# Patient Record
Sex: Male | Born: 1985 | Race: Black or African American | Hispanic: No | Smoking: Current some day smoker
Health system: Southern US, Community
[De-identification: ages and names within clinical notes are randomized; demographics above are authoritative.]

---
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
## 2004-12-08 ENCOUNTER — Emergency Department (HOSPITAL_COMMUNITY): Admission: EM | Admit: 2004-12-08 | Discharge: 2004-12-09 | Payer: Self-pay | Admitting: Emergency Medicine

## 2005-07-23 ENCOUNTER — Emergency Department (HOSPITAL_COMMUNITY): Admission: EM | Admit: 2005-07-23 | Discharge: 2005-07-23 | Payer: Self-pay | Admitting: Emergency Medicine

## 2006-04-04 ENCOUNTER — Emergency Department (HOSPITAL_COMMUNITY): Admission: EM | Admit: 2006-04-04 | Discharge: 2006-04-04 | Payer: Self-pay | Admitting: Emergency Medicine

## 2007-11-24 ENCOUNTER — Emergency Department (HOSPITAL_COMMUNITY): Admission: EM | Admit: 2007-11-24 | Discharge: 2007-11-24 | Payer: Self-pay | Admitting: Emergency Medicine

## 2008-04-20 ENCOUNTER — Emergency Department (HOSPITAL_COMMUNITY): Admission: EM | Admit: 2008-04-20 | Discharge: 2008-04-20 | Payer: Self-pay | Admitting: Emergency Medicine

## 2009-02-23 ENCOUNTER — Observation Stay (HOSPITAL_COMMUNITY): Admission: EM | Admit: 2009-02-23 | Discharge: 2009-02-23 | Payer: Self-pay | Admitting: Emergency Medicine

## 2011-02-28 ENCOUNTER — Emergency Department (HOSPITAL_COMMUNITY)
Admission: EM | Admit: 2011-02-28 | Discharge: 2011-02-28 | Disposition: A | Discharge status: Home / Self Care | Payer: Self-pay | Attending: Emergency Medicine | Admitting: Emergency Medicine

## 2011-02-28 DIAGNOSIS — K089 Disorder of teeth and supporting structures, unspecified: Secondary | ICD-10-CM | POA: Insufficient documentation

## 2011-02-28 DIAGNOSIS — K047 Periapical abscess without sinus: Secondary | ICD-10-CM | POA: Insufficient documentation

## 2011-02-28 DIAGNOSIS — R220 Localized swelling, mass and lump, head: Secondary | ICD-10-CM | POA: Insufficient documentation

## 2011-02-28 DIAGNOSIS — K029 Dental caries, unspecified: Secondary | ICD-10-CM | POA: Insufficient documentation

## 2011-02-28 DIAGNOSIS — R51 Headache: Secondary | ICD-10-CM | POA: Insufficient documentation

## 2013-02-18 ENCOUNTER — Encounter (HOSPITAL_COMMUNITY): Payer: Self-pay

## 2013-02-18 ENCOUNTER — Emergency Department (HOSPITAL_COMMUNITY): Payer: Self-pay

## 2013-02-18 ENCOUNTER — Emergency Department (HOSPITAL_COMMUNITY)
Admission: EM | Admit: 2013-02-18 | Discharge: 2013-02-18 | Disposition: A | Discharge status: Home / Self Care | Payer: Self-pay | Attending: Emergency Medicine | Admitting: Emergency Medicine

## 2013-02-18 DIAGNOSIS — Y9289 Other specified places as the place of occurrence of the external cause: Secondary | ICD-10-CM | POA: Insufficient documentation

## 2013-02-18 DIAGNOSIS — W2209XA Striking against other stationary object, initial encounter: Secondary | ICD-10-CM | POA: Insufficient documentation

## 2013-02-18 DIAGNOSIS — F17200 Nicotine dependence, unspecified, uncomplicated: Secondary | ICD-10-CM | POA: Insufficient documentation

## 2013-02-18 DIAGNOSIS — R059 Cough, unspecified: Secondary | ICD-10-CM | POA: Insufficient documentation

## 2013-02-18 DIAGNOSIS — S301XXA Contusion of abdominal wall, initial encounter: Secondary | ICD-10-CM | POA: Insufficient documentation

## 2013-02-18 DIAGNOSIS — Y9302 Activity, running: Secondary | ICD-10-CM | POA: Insufficient documentation

## 2013-02-18 DIAGNOSIS — R269 Unspecified abnormalities of gait and mobility: Secondary | ICD-10-CM | POA: Insufficient documentation

## 2013-02-18 DIAGNOSIS — R369 Urethral discharge, unspecified: Secondary | ICD-10-CM | POA: Insufficient documentation

## 2013-02-18 DIAGNOSIS — R05 Cough: Secondary | ICD-10-CM | POA: Insufficient documentation

## 2013-02-18 DIAGNOSIS — R300 Dysuria: Secondary | ICD-10-CM | POA: Insufficient documentation

## 2013-02-18 LAB — CBC WITH DIFFERENTIAL/PLATELET
Basophils Absolute: 0 10*3/uL (ref 0.0–0.1)
Basophils Relative: 0 % (ref 0–1)
Eosinophils Absolute: 0.1 10*3/uL (ref 0.0–0.7)
Eosinophils Relative: 1 % (ref 0–5)
HCT: 40.7 % (ref 39.0–52.0)
Hemoglobin: 14.1 g/dL (ref 13.0–17.0)
Lymphocytes Relative: 19 % (ref 12–46)
Lymphs Abs: 1.6 10*3/uL (ref 0.7–4.0)
MCH: 28.4 pg (ref 26.0–34.0)
MCHC: 34.6 g/dL (ref 30.0–36.0)
MCV: 82.1 fL (ref 78.0–100.0)
Monocytes Absolute: 0.6 10*3/uL (ref 0.1–1.0)
Monocytes Relative: 7 % (ref 3–12)
Neutro Abs: 6.4 10*3/uL (ref 1.7–7.7)
Neutrophils Relative %: 73 % (ref 43–77)
Platelets: 270 10*3/uL (ref 150–400)
RBC: 4.96 MIL/uL (ref 4.22–5.81)
RDW: 13.2 % (ref 11.5–15.5)
WBC: 8.8 10*3/uL (ref 4.0–10.5)

## 2013-02-18 LAB — BASIC METABOLIC PANEL
BUN: 10 mg/dL (ref 6–23)
CO2: 27 mEq/L (ref 19–32)
Calcium: 9.2 mg/dL (ref 8.4–10.5)
Chloride: 105 mEq/L (ref 96–112)
Creatinine, Ser: 0.95 mg/dL (ref 0.50–1.35)
GFR calc Af Amer: >90 mL/min (ref >90)
GFR calc non Af Amer: >90 mL/min (ref >90)
Glucose, Bld: 91 mg/dL (ref 70–99)
Potassium: 3.8 mEq/L (ref 3.5–5.1)
Sodium: 139 mEq/L (ref 135–145)

## 2013-02-18 LAB — URINALYSIS, ROUTINE W REFLEX MICROSCOPIC
Bilirubin Urine: NEGATIVE
Glucose, UA: NEGATIVE mg/dL
Hgb urine dipstick: NEGATIVE
Ketones, ur: NEGATIVE mg/dL
Nitrite: NEGATIVE
Protein, ur: NEGATIVE mg/dL
Specific Gravity, Urine: 1.023 (ref 1.005–1.030)
Urobilinogen, UA: 0.2 mg/dL (ref 0.0–1.0)
pH: 6 (ref 5.0–8.0)

## 2013-02-18 LAB — URINE MICROSCOPIC-ADD ON

## 2013-02-18 MED ORDER — IBUPROFEN 800 MG PO TABS: 800.0000 mg | ORAL_TABLET | Freq: Three times a day (TID) | ORAL | Status: DC | PRN

## 2013-02-18 MED ORDER — MORPHINE SULFATE 4 MG/ML IJ SOLN
4.0000 mg | Freq: Once | INTRAMUSCULAR | Status: AC
  Administered 2013-02-18: 4 mg via INTRAVENOUS
  Filled 2013-02-18: qty 1

## 2013-02-18 MED ORDER — HYDROCODONE-ACETAMINOPHEN 5-325 MG PO TABS: 1.0000 | ORAL_TABLET | Freq: Four times a day (QID) | ORAL | Status: DC | PRN

## 2013-02-18 MED ORDER — IOHEXOL 300 MG/ML  SOLN
100.0000 mL | Freq: Once | INTRAMUSCULAR | Status: AC | PRN
  Administered 2013-02-18: 100 mL via INTRAVENOUS

## 2013-02-18 NOTE — Progress Notes (Signed)
[facility] provided patient with a list of primary care resources and a [facility] Orange card application.

## 2013-02-18 NOTE — ED Notes (Addendum)
Patient c/o left lower abdominal pain x 3 days. Patient denies N/V/D, dysuria, or penile discharge. Patient states he has a bulging area to the left lower abdomen.

## 2013-02-18 NOTE — ED Notes (Signed)
Patient transported to CT 

## 2013-02-18 NOTE — ED Provider Notes (Signed)
Medical screening examination/treatment/procedure(s) were performed by non-physician practitioner and as supervising physician I was immediately available for consultation/collaboration.   Christopher J. Pollina, MD 02/18/13 1416 

## 2013-02-18 NOTE — ED Provider Notes (Signed)
CSN: [id number]     Arrival date & time 02/18/13  2725 History     First MD Initiated Contact with Patient 02/18/13 1123     Chief Complaint  Patient presents with  . Abdominal Pain   (Consider location/radiation/quality/duration/timing/severity/associated sxs/prior Treatment) Patient is a 27 y.o. male presenting with abdominal pain. The history is provided by the patient.  Abdominal Pain Pain location:  L flank and LLQ Pain quality: sharp   Pain radiates to:  L flank and back Pain severity:  Severe Onset quality:  Sudden Duration:  3 days Timing:  Constant Progression:  Worsening Chronicity:  New Context: trauma (Pt was running and ran into the corner of a counter)   Relieved by:  Not moving Worsened by:  Coughing, deep breathing, movement, palpation, position changes and urination Ineffective treatments:  None tried Associated symptoms: dysuria (slight dysuria)   Associated symptoms: no anorexia, no chest pain, no constipation, no cough, no diarrhea, no fatigue, no hematemesis, no hematochezia, no hematuria, no nausea, no shortness of breath and no vomiting   Dysuria:    Severity:  Mild   Onset quality:  Unable to specify   Timing:  Intermittent   Progression:  Waxing and waning Pt presents with a 3 day hx of severe left lower abdominal pain that occurred when pt ran into the edge of a counter top. Describes pain as severe, sharp, constant pain that 'brings tears to his eyes'. Pain radiates to back and has caused left lower back pain and left hip pain. Pain has continued to worsen and is a 10/10 in intensity. Pain is worse when breathing, sneezing, and walking. Nothing improves the pain besides laying down and not moving.   History reviewed. No pertinent past medical history. History reviewed. No pertinent past surgical history. Family History  Problem Relation Age of Onset  . Hypertension Mother   . Diabetes Mother   . Hypertension Father   . Lupus Sister   . Asthma  Brother    History  Substance Use Topics  . Smoking status: Current Every Day Smoker -- 0.25 packs/day    Types: Cigarettes  . Smokeless tobacco: Never Used  . Alcohol Use: Yes     Comment: socially    Review of Systems  Constitutional: Negative for fatigue.  Respiratory: Negative for cough and shortness of breath.   Cardiovascular: Negative for chest pain.  Gastrointestinal: Positive for abdominal pain. Negative for nausea, vomiting, diarrhea, constipation, hematochezia, anorexia and hematemesis.  Genitourinary: Positive for dysuria (slight dysuria). Negative for hematuria.  All other systems negative except as documented in the HPI. All pertinent positives and negatives as reviewed in the HPI.   Allergies  Sulfa antibiotics  Home Medications  No current outpatient prescriptions on file. BP 126/73  Pulse 67  Temp(Src) 98.2 F (36.8 C) (Oral)  Resp 16  Ht 5\' 11"  (1.803 m)  Wt 139 lb 8 oz (63.277 kg)  BMI 19.47 kg/m2  SpO2 100% Physical Exam  Constitutional: He is oriented to person, place, and time. Vital signs are normal. He appears well-developed and well-nourished.  HENT:  Head: Normocephalic and atraumatic.  Cardiovascular: Normal rate, regular rhythm, S1 normal, S2 normal and normal heart sounds.   Pulmonary/Chest: Effort normal and breath sounds normal. No respiratory distress.  Abdominal: Soft. Bowel sounds are normal. He exhibits no distension. There is tenderness (Extreme tenderness to light palpation in lateral left lower quadrant and flank area.). There is rebound (Pressure on right side produces rebound tenderness in  LLQ.) and guarding. There is no CVA tenderness.    Musculoskeletal: He exhibits tenderness.       Right hip: He exhibits normal range of motion, normal strength, no tenderness, no bony tenderness, no swelling and no deformity.       Left hip: He exhibits tenderness and bony tenderness. He exhibits normal range of motion, normal strength, no  swelling and no deformity. Lacerations: .5 cm round scab visible. No bleeding, echymosis, or edema present.       Left knee: He exhibits normal range of motion, no swelling and no ecchymosis. No tenderness found.       Lumbar back: He exhibits tenderness and pain. He exhibits normal range of motion, no bony tenderness, no swelling, no edema and no deformity.  Straight leg test produces pain in LLQ.  Neurological: He is alert and oriented to person, place, and time. He has normal strength. No sensory deficit. He exhibits normal muscle tone (Pt has 5/5 strength in LE b/l. Movement and resistance of left LE produces pain in LLQ. ). Gait (pt is unable to tolerate weight on left leg.) abnormal.  Skin: Abrasion noted.     Psychiatric: He has a normal mood and affect. His speech is normal and behavior is normal. Judgment and thought content normal. His mood appears not anxious. He does not exhibit a depressed mood.    ED Course   Procedures (including critical care time)  Labs Reviewed  CBC WITH DIFFERENTIAL  BASIC METABOLIC PANEL  URINALYSIS, ROUTINE W REFLEX MICROSCOPIC   patient most likely has muscular contusion, based on his history of present illness and physical exam, along with his testing here in the emergency department.  Patient is advised return here as needed.  Patient be given pain control and advised to use ice and heat on the area that is sore.  MDM    Carlyle Dolly, PA-C 02/18/13 [phone]

## 2013-04-29 ENCOUNTER — Emergency Department (HOSPITAL_COMMUNITY)
Admission: EM | Admit: 2013-04-29 | Discharge: 2013-04-29 | Disposition: A | Discharge status: Home / Self Care | Payer: 59 | Attending: Emergency Medicine | Admitting: Emergency Medicine

## 2013-04-29 ENCOUNTER — Encounter (HOSPITAL_COMMUNITY): Payer: Self-pay | Admitting: Emergency Medicine

## 2013-04-29 DIAGNOSIS — K089 Disorder of teeth and supporting structures, unspecified: Secondary | ICD-10-CM | POA: Insufficient documentation

## 2013-04-29 DIAGNOSIS — F17200 Nicotine dependence, unspecified, uncomplicated: Secondary | ICD-10-CM | POA: Insufficient documentation

## 2013-04-29 DIAGNOSIS — K0889 Other specified disorders of teeth and supporting structures: Secondary | ICD-10-CM

## 2013-04-29 DIAGNOSIS — K029 Dental caries, unspecified: Secondary | ICD-10-CM | POA: Insufficient documentation

## 2013-04-29 MED ORDER — OXYCODONE-ACETAMINOPHEN 5-325 MG PO TABS: 1.0000 | ORAL_TABLET | ORAL | Status: DC | PRN

## 2013-04-29 MED ORDER — AMOXICILLIN 500 MG PO CAPS: 500.0000 mg | ORAL_CAPSULE | Freq: Three times a day (TID) | ORAL | Status: DC

## 2013-04-29 NOTE — ED Provider Notes (Signed)
CSN: [id number]     Arrival date & time 04/29/13  7829 History   First MD Initiated Contact with Patient 04/29/13 [phone]     Chief Complaint  Patient presents with  . Dental Pain   (Consider location/radiation/quality/duration/timing/severity/associated sxs/prior Treatment) HPI Patient presents emergency chief complaint of dental pain.  The patient states he has had worsening progressive pain in the right lower third molar for the past 3 weeks.  Patient states he's been unable to sleep for the past 3 nights.  He's been taking ibuprofen without relief.  He denies difficulty swallowing although he does have a bit of pain with swelling.  Patient denies fevers, chills, vomiting.  He has a history of Cary in the tooth.  The patient is a current every day smoker.  History reviewed. No pertinent past medical history. History reviewed. No pertinent past surgical history. Family History  Problem Relation Age of Onset  . Hypertension Mother   . Diabetes Mother   . Hypertension Father   . Lupus Sister   . Asthma Brother    History  Substance Use Topics  . Smoking status: Current Every Day Smoker -- 0.25 packs/day    Types: Cigarettes  . Smokeless tobacco: Never Used  . Alcohol Use: No     Comment: socially    Review of Systems  Constitutional: Negative for fever and chills.  HENT: Positive for dental problem. Negative for trouble swallowing and voice change.   Respiratory: Negative for stridor.   Gastrointestinal: Negative for nausea and vomiting.  Skin: Negative for rash.    Allergies  Sulfa antibiotics  Home Medications   Current Outpatient Rx  Name  Route  Sig  Dispense  Refill  . ibuprofen (ADVIL,MOTRIN) 200 MG tablet   Oral   Take 600 mg by mouth every 6 (six) hours as needed for pain.          BP 133/87  Pulse 79  Temp(Src) 98.1 F (36.7 C) (Oral)  Resp 20  Ht 5\' 11"  (1.803 m)  Wt 144 lb 14.4 oz (65.726 kg)  BMI 20.22 kg/m2  SpO2 99% Physical Exam  Nursing note  and vitals reviewed. Constitutional: He appears well-developed and well-nourished. No distress.  HENT:  Head: Normocephalic and atraumatic.  Right third lower molar with Cary and exposed dentin.  Patient is tender to palpation in the gingiva.  There is some mild erythema without fluctuance or induration.  Posterior pharynx is erythematous bilaterally.  No sign of peritonsillar abscess.  Airway is patent  Eyes: Conjunctivae are normal. No scleral icterus.  Neck: Normal range of motion. Neck supple.  Right tonsillar lymphadenopathy tender to palpation  Cardiovascular: Normal rate, regular rhythm and normal heart sounds.   Pulmonary/Chest: Effort normal and breath sounds normal. No respiratory distress.  Abdominal: Soft. There is no tenderness.  Musculoskeletal: He exhibits no edema.  Neurological: He is alert.  Skin: Skin is warm and dry. He is not diaphoretic.  Psychiatric: His behavior is normal.    ED Course  Procedures (including critical care time) Labs Review Labs Reviewed - No data to display Imaging Review No results found.  EKG Interpretation   None      Dental Performed by: Arthor Captain Authorized by: Arthor Captain Consent: Verbal consent obtained. Patient understanding: patient states understanding of the procedure being performed Patient identity confirmed: verbally with patient Local anesthesia used: yes Local anesthetic: bupivacaine 0.5% with epinephrine Anesthetic total:0.4 ml Patient sedated: no Patient tolerance: Patient tolerated the procedure well with  no immediate complications.    MDM   1. Pain, dental    9:09 AM  Filed Vitals:   04/29/13 0824  BP: 133/87  Pulse: 79  Temp: 98.1 F (36.7 C)  Resp: 20   Patient with toothache.  No gross abscess.  Exam unconcerning for Ludwig's angina or spread of infection.  Will treat with penicillin and pain medicine.  Urged patient to follow-up with dentist.       Arthor Captain, PA-C 04/29/13  [phone]

## 2013-04-29 NOTE — ED Provider Notes (Signed)
Medical screening examination/treatment/procedure(s) were performed by non-physician practitioner and as supervising physician I was immediately available for consultation/collaboration.  EKG Interpretation   None         Jaine Estabrooks T Ambar Raphael, MD 04/29/13 1657 

## 2013-04-29 NOTE — ED Notes (Signed)
C/O right lower dental pain x 3 weeks.

## 2013-11-11 ENCOUNTER — Emergency Department (HOSPITAL_COMMUNITY)
Admission: EM | Admit: 2013-11-11 | Discharge: 2013-11-11 | Disposition: A | Discharge status: Home / Self Care | Payer: 59 | Attending: Emergency Medicine | Admitting: Emergency Medicine

## 2013-11-11 ENCOUNTER — Encounter (HOSPITAL_COMMUNITY): Payer: Self-pay | Admitting: Emergency Medicine

## 2013-11-11 DIAGNOSIS — Z23 Encounter for immunization: Secondary | ICD-10-CM | POA: Insufficient documentation

## 2013-11-11 DIAGNOSIS — W268XXA Contact with other sharp object(s), not elsewhere classified, initial encounter: Secondary | ICD-10-CM | POA: Insufficient documentation

## 2013-11-11 DIAGNOSIS — S61519A Laceration without foreign body of unspecified wrist, initial encounter: Secondary | ICD-10-CM

## 2013-11-11 DIAGNOSIS — Z792 Long term (current) use of antibiotics: Secondary | ICD-10-CM | POA: Insufficient documentation

## 2013-11-11 DIAGNOSIS — S61509A Unspecified open wound of unspecified wrist, initial encounter: Secondary | ICD-10-CM | POA: Insufficient documentation

## 2013-11-11 DIAGNOSIS — F17200 Nicotine dependence, unspecified, uncomplicated: Secondary | ICD-10-CM | POA: Insufficient documentation

## 2013-11-11 DIAGNOSIS — Y9289 Other specified places as the place of occurrence of the external cause: Secondary | ICD-10-CM | POA: Insufficient documentation

## 2013-11-11 DIAGNOSIS — Y9383 Activity, rough housing and horseplay: Secondary | ICD-10-CM | POA: Insufficient documentation

## 2013-11-11 MED ORDER — TETANUS-DIPHTH-ACELL PERTUSSIS 5-2.5-18.5 LF-MCG/0.5 IM SUSP
0.5000 mL | Freq: Once | INTRAMUSCULAR | Status: AC
  Administered 2013-11-11: 0.5 mL via INTRAMUSCULAR
  Filled 2013-11-11: qty 0.5

## 2013-11-11 NOTE — Discharge Instructions (Signed)
Laceration Care, Adult YOUR SUTURES SHOULD BE REMOVED IN 7-10 DAYS.  A laceration is a cut or lesion that goes through all layers of the skin and into the tissue just beneath the skin. TREATMENT  Some lacerations may not require closure. Some lacerations may not be able to be closed due to an increased risk of infection. It is important to see your caregiver as soon as possible after an injury to minimize the risk of infection and maximize the opportunity for successful closure. If closure is appropriate, pain medicines may be given, if needed. The wound will be cleaned to help prevent infection. Your caregiver will use stitches (sutures), staples, wound glue (adhesive), or skin adhesive strips to repair the laceration. These tools bring the skin edges together to allow for faster healing and a better cosmetic outcome. However, all wounds will heal with a scar. Once the wound has healed, scarring can be minimized by covering the wound with sunscreen during the day for 1 full year. HOME CARE INSTRUCTIONS  For sutures or staples:  Keep the wound clean and dry.  If you were given a bandage (dressing), you should change it at least once a day. Also, change the dressing if it becomes wet or dirty, or as directed by your caregiver.  Wash the wound with soap and water 2 times a day. Rinse the wound off with water to remove all soap. Pat the wound dry with a clean towel.  After cleaning, apply a thin layer of the antibiotic ointment as recommended by your caregiver. This will help prevent infection and keep the dressing from sticking.  You may shower as usual after the first 24 hours. Do not soak the wound in water until the sutures are removed.  Only take over-the-counter or prescription medicines for pain, discomfort, or fever as directed by your caregiver.  Get your sutures or staples removed as directed by your caregiver. For skin adhesive strips:  Keep the wound clean and dry.  Do not get the  skin adhesive strips wet. You may bathe carefully, using caution to keep the wound dry.  If the wound gets wet, pat it dry with a clean towel.  Skin adhesive strips will fall off on their own. You may trim the strips as the wound heals. Do not remove skin adhesive strips that are still stuck to the wound. They will fall off in time. For wound adhesive:  You may briefly wet your wound in the shower or bath. Do not soak or scrub the wound. Do not swim. Avoid periods of heavy perspiration until the skin adhesive has fallen off on its own. After showering or bathing, gently pat the wound dry with a clean towel.  Do not apply liquid medicine, cream medicine, or ointment medicine to your wound while the skin adhesive is in place. This may loosen the film before your wound is healed.  If a dressing is placed over the wound, be careful not to apply tape directly over the skin adhesive. This may cause the adhesive to be pulled off before the wound is healed.  Avoid prolonged exposure to sunlight or tanning lamps while the skin adhesive is in place. Exposure to ultraviolet light in the first year will darken the scar.  The skin adhesive will usually remain in place for 5 to 10 days, then naturally fall off the skin. Do not pick at the adhesive film. You may need a tetanus shot if:  You cannot remember when you had your last  tetanus shot.  You have never had a tetanus shot. If you get a tetanus shot, your arm may swell, get red, and feel warm to the touch. This is common and not a problem. If you need a tetanus shot and you choose not to have one, there is a rare chance of getting tetanus. Sickness from tetanus can be serious. SEEK MEDICAL CARE IF:   You have redness, swelling, or increasing pain in the wound.  You see a red line that goes away from the wound.  You have yellowish-white fluid (pus) coming from the wound.  You have a fever.  You notice a bad smell coming from the wound or  dressing.  Your wound breaks open before or after sutures have been removed.  You notice something coming out of the wound such as wood or glass.  Your wound is on your hand or foot and you cannot move a finger or toe. SEEK IMMEDIATE MEDICAL CARE IF:   Your pain is not controlled with prescribed medicine.  You have severe swelling around the wound causing pain and numbness or a change in color in your arm, hand, leg, or foot.  Your wound splits open and starts bleeding.  You have worsening numbness, weakness, or loss of function of any joint around or beyond the wound.  You develop painful lumps near the wound or on the skin anywhere on your body. MAKE SURE YOU:   Understand these instructions.  Will watch your condition.  Will get help right away if you are not doing well or get worse. Document Released: 06/13/2005 Document Revised: 09/05/2011 Document Reviewed: 12/07/2010 [employer] Patient Information 2014 [place], [place].

## 2013-11-11 NOTE — ED Provider Notes (Signed)
CSN: [id number]     Arrival date & time 11/11/13  0213 History   First MD Initiated Contact with Patient 11/11/13 0239     Chief Complaint  Patient presents with  . Extremity Laceration     (Consider location/radiation/quality/duration/timing/severity/associated sxs/prior Treatment) HPI  This a 28 year old male with no significant past medical history who presents with a laceration to the left wrist. Patient states that he was "horsing around" with his little brother he cut his left wrist on piece of metal. Bleeding is controlled. Last tetanus greater than 5 years ago. He denies any other injury. Denies any weakness, numbness or tingling in the hand.  History reviewed. No pertinent past medical history. History reviewed. No pertinent past surgical history. Family History  Problem Relation Age of Onset  . Hypertension Mother   . Diabetes Mother   . Hypertension Father   . Lupus Sister   . Asthma Brother    History  Substance Use Topics  . Smoking status: Current Every Day Smoker -- 0.25 packs/day    Types: Cigarettes  . Smokeless tobacco: Never Used  . Alcohol Use: Yes     Comment: socially    Review of Systems  Musculoskeletal:       Wrist  Skin: Positive for wound.  Neurological: Negative for weakness and numbness.  All other systems reviewed and are negative.     Allergies  Shellfish allergy and Sulfa antibiotics  Home Medications   Prior to Admission medications   Medication Sig Start Date End Date Taking? Authorizing Provider  amoxicillin (AMOXIL) 500 MG capsule Take 1 capsule (500 mg total) by mouth 3 (three) times daily. 04/29/13   Arthor CaptainAbigail Harris, PA-C  ibuprofen (ADVIL,MOTRIN) 200 MG tablet Take 600 mg by mouth every 6 (six) hours as needed for pain.    Historical Provider, MD  oxyCODONE-acetaminophen (PERCOCET) 5-325 MG per tablet Take 1-2 tablets by mouth every 4 (four) hours as needed for pain. 04/29/13   Abigail Harris, PA-C   BP 140/79  Pulse 94   Temp(Src) 98.6 F (37 C) (Oral)  Resp 18  Ht 6' (1.829 m)  Wt 145 lb (65.772 kg)  BMI 19.66 kg/m2  SpO2 98% Physical Exam  Nursing note and vitals reviewed. Constitutional: He is oriented to person, place, and time. He appears well-developed and well-nourished.  HENT:  Head: Normocephalic and atraumatic.  Cardiovascular: Normal rate and regular rhythm.   Pulmonary/Chest: Effort normal. No respiratory distress.  Musculoskeletal: He exhibits no edema.  Normal flexion and extension at the wrist and fingers, no obvious tendon injury, 5 cm laceration over the palmar aspect of the left wrist, bleeding controlled  Lymphadenopathy:    He has no cervical adenopathy.  Neurological: He is alert and oriented to person, place, and time.  Skin: Skin is warm and dry.  Laceration as described above  Psychiatric: He has a normal mood and affect.    ED Course  Procedures (including critical care time)  LACERATION REPAIR Performed by: Shon Batonourtney F Fahed Morten Authorized by: Mayer Maskerourtney F Aricka Goldberger Consent: Verbal consent obtained. Risks and benefits: risks, benefits and alternatives were discussed Consent given by: patient Patient identity confirmed: provided demographic data Prepped and Draped in normal sterile fashion Wound explored  Laceration Location: wrist  Laceration Length: 5cm  No Foreign Bodies seen or palpated  Anesthesia: local infiltration  Local anesthetic: lidocaine 2% wo epinephrine  Anesthetic total: 2 ml  Irrigation method: syringe Amount of cleaning: standard  Skin closure: 4-0 Nylon Number of sutures: 6  Technique: interrupted  Patient tolerance: Patient tolerated the procedure well with no immediate complications.  Labs Review Labs Reviewed - No data to display  Imaging Review No results found.   EKG Interpretation None      MDM   Final diagnoses:  Wrist laceration    Patient presents with laceration to the left wrist. No obvious tendon or vascular  injury noted, no foreign body. Patient's tetanus was updated. Laceration repaired at bedside. Patient to followup with her care in 7-10 days for suture removal.  After history, exam, and medical workup I feel the patient has been appropriately medically screened and is safe for discharge home. Pertinent diagnoses were discussed with the patient. Patient was given return precautions.     Shon Batonourtney F Makar Slatter, MD 11/11/13 [phone]

## 2013-11-11 NOTE — ED Notes (Signed)
Pt states he was horse playing and fell cutting his left wrist on a piece of metal   Pt denies pain at this time  Bleeding controlled at this time

## 2014-02-03 ENCOUNTER — Emergency Department (HOSPITAL_COMMUNITY)
Admission: EM | Admit: 2014-02-03 | Discharge: 2014-02-03 | Disposition: A | Discharge status: Home / Self Care | Payer: 59 | Attending: Emergency Medicine | Admitting: Emergency Medicine

## 2014-02-03 ENCOUNTER — Emergency Department (HOSPITAL_COMMUNITY): Payer: 59

## 2014-02-03 ENCOUNTER — Encounter (HOSPITAL_COMMUNITY): Payer: Self-pay | Admitting: Emergency Medicine

## 2014-02-03 DIAGNOSIS — S0083XA Contusion of other part of head, initial encounter: Secondary | ICD-10-CM | POA: Insufficient documentation

## 2014-02-03 DIAGNOSIS — S199XXA Unspecified injury of neck, initial encounter: Secondary | ICD-10-CM

## 2014-02-03 DIAGNOSIS — S01511A Laceration without foreign body of lip, initial encounter: Secondary | ICD-10-CM

## 2014-02-03 DIAGNOSIS — T7411XA Adult physical abuse, confirmed, initial encounter: Secondary | ICD-10-CM | POA: Insufficient documentation

## 2014-02-03 DIAGNOSIS — S1093XA Contusion of unspecified part of neck, initial encounter: Secondary | ICD-10-CM

## 2014-02-03 DIAGNOSIS — T07XXXA Unspecified multiple injuries, initial encounter: Secondary | ICD-10-CM

## 2014-02-03 DIAGNOSIS — F17200 Nicotine dependence, unspecified, uncomplicated: Secondary | ICD-10-CM | POA: Insufficient documentation

## 2014-02-03 DIAGNOSIS — S279XXA Injury of unspecified intrathoracic organ, initial encounter: Secondary | ICD-10-CM | POA: Insufficient documentation

## 2014-02-03 DIAGNOSIS — S0993XA Unspecified injury of face, initial encounter: Secondary | ICD-10-CM | POA: Insufficient documentation

## 2014-02-03 DIAGNOSIS — S01501A Unspecified open wound of lip, initial encounter: Secondary | ICD-10-CM | POA: Insufficient documentation

## 2014-02-03 DIAGNOSIS — S0003XA Contusion of scalp, initial encounter: Secondary | ICD-10-CM | POA: Insufficient documentation

## 2014-02-03 MED ORDER — SODIUM CHLORIDE 0.9 % IV BOLUS (SEPSIS)
1000.0000 mL | Freq: Once | INTRAVENOUS | Status: AC
  Administered 2014-02-03: 1000 mL via INTRAVENOUS

## 2014-02-03 MED ORDER — MORPHINE SULFATE 4 MG/ML IJ SOLN
4.0000 mg | Freq: Once | INTRAMUSCULAR | Status: AC
  Administered 2014-02-03: 4 mg via INTRAVENOUS
  Filled 2014-02-03: qty 1

## 2014-02-03 MED ORDER — ONDANSETRON HCL 4 MG/2ML IJ SOLN
4.0000 mg | Freq: Once | INTRAMUSCULAR | Status: AC
  Administered 2014-02-03: 4 mg via INTRAVENOUS
  Filled 2014-02-03: qty 2

## 2014-02-03 MED ORDER — METHOCARBAMOL 500 MG PO TABS: 500.0000 mg | ORAL_TABLET | Freq: Two times a day (BID) | ORAL | Status: DC

## 2014-02-03 MED ORDER — IBUPROFEN 800 MG PO TABS: 800.0000 mg | ORAL_TABLET | Freq: Three times a day (TID) | ORAL | Status: DC

## 2014-02-03 MED ORDER — KETOROLAC TROMETHAMINE 30 MG/ML IJ SOLN
30.0000 mg | Freq: Once | INTRAMUSCULAR | Status: AC
  Administered 2014-02-03: 30 mg via INTRAVENOUS
  Filled 2014-02-03: qty 1
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
## 2014-02-03 NOTE — ED Provider Notes (Signed)
Medical screening examination/treatment/procedure(s) were performed by non-physician practitioner and as supervising physician I was immediately available for consultation/collaboration.   EKG Interpretation None        Allee Busk F Zakhari Fogel, MD 02/03/14 0736 

## 2014-02-03 NOTE — ED Notes (Signed)
Patient transported to X-ray 
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
## 2014-02-03 NOTE — ED Notes (Signed)
MD at bedside. EDPA BOWIE TO EVALUATE THIS PT BEFORE THIS WRITER

## 2014-02-03 NOTE — ED Notes (Signed)
Patient transported to CT 
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
## 2014-02-09 ENCOUNTER — Emergency Department (HOSPITAL_COMMUNITY): Payer: Self-pay

## 2014-02-09 ENCOUNTER — Encounter (HOSPITAL_COMMUNITY): Payer: Self-pay | Admitting: Emergency Medicine

## 2014-02-09 ENCOUNTER — Emergency Department (HOSPITAL_COMMUNITY): Payer: 59

## 2014-02-09 ENCOUNTER — Emergency Department (HOSPITAL_COMMUNITY)
Admission: EM | Admit: 2014-02-09 | Discharge: 2014-02-09 | Disposition: A | Discharge status: Home / Self Care | Payer: Self-pay | Attending: Emergency Medicine | Admitting: Emergency Medicine

## 2014-02-09 DIAGNOSIS — Z79899 Other long term (current) drug therapy: Secondary | ICD-10-CM | POA: Insufficient documentation

## 2014-02-09 DIAGNOSIS — F17200 Nicotine dependence, unspecified, uncomplicated: Secondary | ICD-10-CM | POA: Insufficient documentation

## 2014-02-09 DIAGNOSIS — S40012D Contusion of left shoulder, subsequent encounter: Secondary | ICD-10-CM

## 2014-02-09 DIAGNOSIS — M25519 Pain in unspecified shoulder: Secondary | ICD-10-CM | POA: Insufficient documentation

## 2014-02-09 DIAGNOSIS — Z791 Long term (current) use of non-steroidal anti-inflammatories (NSAID): Secondary | ICD-10-CM | POA: Insufficient documentation

## 2014-02-09 DIAGNOSIS — M549 Dorsalgia, unspecified: Secondary | ICD-10-CM | POA: Insufficient documentation

## 2014-02-09 DIAGNOSIS — G8911 Acute pain due to trauma: Secondary | ICD-10-CM | POA: Insufficient documentation

## 2014-02-09 MED ORDER — OXYCODONE-ACETAMINOPHEN 5-325 MG PO TABS
2.0000 | ORAL_TABLET | Freq: Once | ORAL | Status: AC
  Administered 2014-02-09: 2 via ORAL
  Filled 2014-02-09: qty 2

## 2014-02-09 MED ORDER — OXYCODONE-ACETAMINOPHEN 5-325 MG PO TABS: 2.0000 | ORAL_TABLET | ORAL | Status: DC | PRN

## 2014-02-09 NOTE — ED Notes (Signed)
Patient transported to X-ray 

## 2014-02-09 NOTE — Discharge Instructions (Signed)
Rest, as much as possible. Try using heat on the sore area 2 or 3 times a day. Followup with the orthopedic doctor if your pain is not better in one to 2 weeks. Do not drive within 6 hours of taking the pain medication.    Contusion A contusion is a deep bruise. Contusions are the result of an injury that caused bleeding under the skin. The contusion may turn blue, purple, or yellow. Minor injuries will give you a painless contusion, but more severe contusions may stay painful and swollen for a few weeks.  CAUSES  A contusion is usually caused by a blow, trauma, or direct force to an area of the body. SYMPTOMS   Swelling and redness of the injured area.  Bruising of the injured area.  Tenderness and soreness of the injured area.  Pain. DIAGNOSIS  The diagnosis can be made by taking a history and physical exam. An X-ray, CT scan, or MRI may be needed to determine if there were any associated injuries, such as fractures. TREATMENT  Specific treatment will depend on what area of the body was injured. In general, the best treatment for a contusion is resting, icing, elevating, and applying cold compresses to the injured area. Over-the-counter medicines may also be recommended for pain control. Ask your caregiver what the best treatment is for your contusion. HOME CARE INSTRUCTIONS   Put ice on the injured area.  Put ice in a plastic bag.  Place a towel between your skin and the bag.  Leave the ice on for 15-20 minutes, 3-4 times a day, or as directed by your health care provider.  Only take over-the-counter or prescription medicines for pain, discomfort, or fever as directed by your caregiver. Your caregiver may recommend avoiding anti-inflammatory medicines (aspirin, ibuprofen, and naproxen) for 48 hours because these medicines may increase bruising.  Rest the injured area.  If possible, elevate the injured area to reduce swelling. SEEK IMMEDIATE MEDICAL CARE IF:   You have  increased bruising or swelling.  You have pain that is getting worse.  Your swelling or pain is not relieved with medicines. MAKE SURE YOU:   Understand these instructions.  Will watch your condition.  Will get help right away if you are not doing well or get worse. Document Released: 03/23/2005 Document Revised: 06/18/2013 Document Reviewed: 04/18/2011 [facility] Patient Information 2015 [place], [place]. This information is not intended to replace advice given to you by your health care provider. Make sure you discuss any questions you have with your health care provider.

## 2014-02-09 NOTE — ED Notes (Signed)
Rn made pt, Andre Frost, CN, and Dr Effie ShyWentz aware that pt is requesting a status update from dr. Theola Sequinn will f/u and continue to monitor.
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
## 2014-02-09 NOTE — ED Notes (Signed)
Patient transported to CT 

## 2014-02-09 NOTE — ED Notes (Signed)
MD at bedside. 
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
## 2015-03-10 ENCOUNTER — Emergency Department (HOSPITAL_COMMUNITY): Payer: Worker's Compensation | Admitting: Anesthesiology

## 2015-03-10 ENCOUNTER — Ambulatory Visit (HOSPITAL_COMMUNITY)
Admission: EM | Admit: 2015-03-10 | Discharge: 2015-03-10 | Disposition: A | Discharge status: Home / Self Care | Payer: Worker's Compensation | Attending: Emergency Medicine | Admitting: Emergency Medicine

## 2015-03-10 ENCOUNTER — Encounter (HOSPITAL_COMMUNITY)
Admission: EM | Disposition: A | Discharge status: Home / Self Care | Payer: Self-pay | Source: Home / Self Care | Attending: Emergency Medicine

## 2015-03-10 ENCOUNTER — Emergency Department (HOSPITAL_COMMUNITY): Payer: Worker's Compensation

## 2015-03-10 ENCOUNTER — Encounter (HOSPITAL_COMMUNITY): Payer: Self-pay | Admitting: Emergency Medicine

## 2015-03-10 DIAGNOSIS — F17210 Nicotine dependence, cigarettes, uncomplicated: Secondary | ICD-10-CM | POA: Insufficient documentation

## 2015-03-10 DIAGNOSIS — W230XXA Caught, crushed, jammed, or pinched between moving objects, initial encounter: Secondary | ICD-10-CM | POA: Insufficient documentation

## 2015-03-10 DIAGNOSIS — S68122A Partial traumatic metacarpophalangeal amputation of right middle finger, initial encounter: Secondary | ICD-10-CM | POA: Diagnosis not present

## 2015-03-10 DIAGNOSIS — S68119A Complete traumatic metacarpophalangeal amputation of unspecified finger, initial encounter: Secondary | ICD-10-CM

## 2015-03-10 DIAGNOSIS — S62632B Displaced fracture of distal phalanx of right middle finger, initial encounter for open fracture: Secondary | ICD-10-CM

## 2015-03-10 HISTORY — PX: AMPUTATION: SHX166

## 2015-03-10 SURGERY — AMPUTATION DIGIT
Anesthesia: General | Site: Finger | Laterality: Right

## 2015-03-10 MED ORDER — HYDROMORPHONE HCL 1 MG/ML IJ SOLN
1.0000 mg | Freq: Once | INTRAMUSCULAR | Status: AC
  Administered 2015-03-10: 1 mg via INTRAVENOUS
  Filled 2015-03-10: qty 1

## 2015-03-10 MED ORDER — BUPIVACAINE HCL (PF) 0.25 % IJ SOLN
INTRAMUSCULAR | Status: DC | PRN
  Administered 2015-03-10: 17 mL

## 2015-03-10 MED ORDER — FENTANYL CITRATE (PF) 250 MCG/5ML IJ SOLN
INTRAMUSCULAR | Status: AC
  Filled 2015-03-10: qty 5

## 2015-03-10 MED ORDER — OXYCODONE HCL 5 MG PO TABS
5.0000 mg | ORAL_TABLET | Freq: Once | ORAL | Status: AC | PRN
  Administered 2015-03-10: 5 mg via ORAL

## 2015-03-10 MED ORDER — ACETAMINOPHEN 160 MG/5ML PO SOLN
325.0000 mg | ORAL | Status: DC | PRN
Start: 2015-03-10 — End: 2015-03-11
  Filled 2015-03-10: qty 20.3

## 2015-03-10 MED ORDER — LACTATED RINGERS IV SOLN
INTRAVENOUS | Status: DC
  Administered 2015-03-10 (×2): via INTRAVENOUS

## 2015-03-10 MED ORDER — EPHEDRINE SULFATE 50 MG/ML IJ SOLN
INTRAMUSCULAR | Status: DC | PRN
  Administered 2015-03-10 (×2): 5 mg via INTRAVENOUS

## 2015-03-10 MED ORDER — OXYCODONE HCL 5 MG PO TABS: 10.0000 mg | ORAL_TABLET | ORAL | Status: DC | PRN

## 2015-03-10 MED ORDER — HYDROMORPHONE HCL 1 MG/ML IJ SOLN
INTRAMUSCULAR | Status: AC
  Filled 2015-03-10: qty 1

## 2015-03-10 MED ORDER — SODIUM CHLORIDE 0.9 % IR SOLN
Status: DC | PRN
  Administered 2015-03-10: 3000 mL

## 2015-03-10 MED ORDER — PROPOFOL 10 MG/ML IV BOLUS
INTRAVENOUS | Status: AC
  Filled 2015-03-10: qty 20

## 2015-03-10 MED ORDER — ONDANSETRON HCL 4 MG/2ML IJ SOLN
INTRAMUSCULAR | Status: AC
  Filled 2015-03-10: qty 2

## 2015-03-10 MED ORDER — ACETAMINOPHEN 325 MG PO TABS: 325.0000 mg | ORAL_TABLET | ORAL | Status: DC | PRN

## 2015-03-10 MED ORDER — FENTANYL CITRATE (PF) 100 MCG/2ML IJ SOLN
INTRAMUSCULAR | Status: DC | PRN
  Administered 2015-03-10: 50 ug via INTRAVENOUS
  Administered 2015-03-10: 25 ug via INTRAVENOUS

## 2015-03-10 MED ORDER — PROPOFOL 10 MG/ML IV BOLUS
INTRAVENOUS | Status: DC | PRN
  Administered 2015-03-10: 200 mg via INTRAVENOUS

## 2015-03-10 MED ORDER — MIDAZOLAM HCL 5 MG/5ML IJ SOLN
INTRAMUSCULAR | Status: DC | PRN
  Administered 2015-03-10: 2 mg via INTRAVENOUS

## 2015-03-10 MED ORDER — ONDANSETRON HCL 4 MG/2ML IJ SOLN
INTRAMUSCULAR | Status: DC | PRN
Start: 2015-03-10 — End: 2015-03-10
  Administered 2015-03-10: 4 mg via INTRAVENOUS

## 2015-03-10 MED ORDER — OXYCODONE HCL 5 MG PO TABS
ORAL_TABLET | ORAL | Status: AC
  Filled 2015-03-10: qty 1

## 2015-03-10 MED ORDER — PHENYLEPHRINE HCL 10 MG/ML IJ SOLN
INTRAMUSCULAR | Status: DC | PRN
  Administered 2015-03-10: 80 ug via INTRAVENOUS
  Administered 2015-03-10: 40 ug via INTRAVENOUS
  Administered 2015-03-10 (×2): 80 ug via INTRAVENOUS

## 2015-03-10 MED ORDER — 0.9 % SODIUM CHLORIDE (POUR BTL) OPTIME
TOPICAL | Status: DC | PRN
  Administered 2015-03-10: 1000 mL

## 2015-03-10 MED ORDER — MIDAZOLAM HCL 2 MG/2ML IJ SOLN
INTRAMUSCULAR | Status: AC
  Filled 2015-03-10: qty 4

## 2015-03-10 MED ORDER — CEFAZOLIN SODIUM-DEXTROSE 2-3 GM-% IV SOLR
INTRAVENOUS | Status: DC | PRN
  Administered 2015-03-10: 2 g via INTRAVENOUS

## 2015-03-10 MED ORDER — HYDROMORPHONE HCL 1 MG/ML IJ SOLN
0.2500 mg | INTRAMUSCULAR | Status: DC | PRN
  Administered 2015-03-10 (×2): 0.25 mg via INTRAVENOUS
  Administered 2015-03-10: 0.5 mg via INTRAVENOUS

## 2015-03-10 MED ORDER — OXYCODONE HCL 5 MG/5ML PO SOLN: 5.0000 mg | Freq: Once | ORAL | Status: AC | PRN

## 2015-03-10 MED ORDER — MORPHINE SULFATE (PF) 4 MG/ML IV SOLN
4.0000 mg | Freq: Once | INTRAVENOUS | Status: AC
  Administered 2015-03-10: 4 mg via INTRAVENOUS
  Filled 2015-03-10: qty 1

## 2015-03-10 MED ORDER — CEFAZOLIN SODIUM 1-5 GM-% IV SOLN
1.0000 g | Freq: Once | INTRAVENOUS | Status: AC
  Administered 2015-03-10: 1 g via INTRAVENOUS
  Filled 2015-03-10: qty 50

## 2015-03-10 MED ORDER — CEPHALEXIN 500 MG PO CAPS: 500.0000 mg | ORAL_CAPSULE | Freq: Four times a day (QID) | ORAL | Status: DC

## 2015-03-10 SURGICAL SUPPLY — 58 items
BANDAGE ELASTIC 3 VELCRO ST LF (GAUZE/BANDAGES/DRESSINGS) ×2 IMPLANT
BANDAGE ELASTIC 4 VELCRO ST LF (GAUZE/BANDAGES/DRESSINGS) ×2 IMPLANT
BANDAGE GAUZE 4  KLING STR (GAUZE/BANDAGES/DRESSINGS) ×2 IMPLANT
BNDG COHESIVE 1X5 TAN STRL LF (GAUZE/BANDAGES/DRESSINGS) IMPLANT
BNDG COHESIVE 3X5 TAN STRL LF (GAUZE/BANDAGES/DRESSINGS) ×2 IMPLANT
BNDG CONFORM 2 STRL LF (GAUZE/BANDAGES/DRESSINGS) IMPLANT
BNDG GAUZE ELAST 4 BULKY (GAUZE/BANDAGES/DRESSINGS) ×6 IMPLANT
CANISTER SUCTION 2500CC (MISCELLANEOUS) ×2 IMPLANT
CORDS BIPOLAR (ELECTRODE) ×3 IMPLANT
COVER SURGICAL LIGHT HANDLE (MISCELLANEOUS) ×3 IMPLANT
CUFF TOURNIQUET SINGLE 18IN (TOURNIQUET CUFF) ×3 IMPLANT
CUFF TOURNIQUET SINGLE 24IN (TOURNIQUET CUFF) IMPLANT
DRAPE OEC MINIVIEW 54X84 (DRAPES) IMPLANT
DRAPE SURG 17X23 STRL (DRAPES) ×3 IMPLANT
DRSG ADAPTIC 3X8 NADH LF (GAUZE/BANDAGES/DRESSINGS) ×2 IMPLANT
GAUZE SPONGE 2X2 8PLY STRL LF (GAUZE/BANDAGES/DRESSINGS) IMPLANT
GAUZE SPONGE 4X4 12PLY STRL (GAUZE/BANDAGES/DRESSINGS) IMPLANT
GAUZE XEROFORM 1X8 LF (GAUZE/BANDAGES/DRESSINGS) IMPLANT
GAUZE XEROFORM 5X9 LF (GAUZE/BANDAGES/DRESSINGS) ×2 IMPLANT
GLOVE BIOGEL M STRL SZ7.5 (GLOVE) ×3 IMPLANT
GLOVE SS BIOGEL STRL SZ 8 (GLOVE) ×1 IMPLANT
GLOVE SUPERSENSE BIOGEL SZ 8 (GLOVE) ×2
GOWN STRL REUS W/ TWL LRG LVL3 (GOWN DISPOSABLE) ×2 IMPLANT
GOWN STRL REUS W/ TWL XL LVL3 (GOWN DISPOSABLE) ×3 IMPLANT
GOWN STRL REUS W/TWL LRG LVL3 (GOWN DISPOSABLE) ×6
GOWN STRL REUS W/TWL XL LVL3 (GOWN DISPOSABLE) ×9
KIT BASIN OR (CUSTOM PROCEDURE TRAY) ×3 IMPLANT
KIT ROOM TURNOVER OR (KITS) ×3 IMPLANT
MANIFOLD NEPTUNE II (INSTRUMENTS) ×1 IMPLANT
NDL HYPO 25GX1X1/2 BEV (NEEDLE) IMPLANT
NEEDLE HYPO 25GX1X1/2 BEV (NEEDLE) IMPLANT
NS IRRIG 1000ML POUR BTL (IV SOLUTION) ×3 IMPLANT
PACK ORTHO EXTREMITY (CUSTOM PROCEDURE TRAY) ×3 IMPLANT
PAD ARMBOARD 7.5X6 YLW CONV (MISCELLANEOUS) ×6 IMPLANT
PAD CAST 3X4 CTTN HI CHSV (CAST SUPPLIES) IMPLANT
PAD CAST 4YDX4 CTTN HI CHSV (CAST SUPPLIES) IMPLANT
PADDING CAST COTTON 3X4 STRL (CAST SUPPLIES) ×3
PADDING CAST COTTON 4X4 STRL (CAST SUPPLIES) ×3
SET CYSTO W/LG BORE CLAMP LF (SET/KITS/TRAYS/PACK) ×2 IMPLANT
SOLUTION BETADINE 4OZ (MISCELLANEOUS) ×1 IMPLANT
SPECIMEN JAR SMALL (MISCELLANEOUS) ×3 IMPLANT
SPLINT FIBERGLASS 4X30 (CAST SUPPLIES) ×2 IMPLANT
SPONGE GAUZE 2X2 STER 10/PKG (GAUZE/BANDAGES/DRESSINGS)
SPONGE GAUZE 4X4 12PLY STER LF (GAUZE/BANDAGES/DRESSINGS) ×2 IMPLANT
SPONGE SCRUB IODOPHOR (GAUZE/BANDAGES/DRESSINGS) ×1 IMPLANT
SUCTION FRAZIER TIP 10 FR DISP (SUCTIONS) ×2 IMPLANT
SUT CHROMIC 4 0 PS 2 18 (SUTURE) ×2 IMPLANT
SUT CHROMIC 5 0 P 3 (SUTURE) ×8 IMPLANT
SUT MERSILENE 4 0 P 3 (SUTURE) IMPLANT
SUT PROLENE 4 0 PS 2 18 (SUTURE) ×2 IMPLANT
SUT PROLENE 5 0 PS 2 (SUTURE) ×6 IMPLANT
SYR CONTROL 10ML LL (SYRINGE) IMPLANT
TOWEL OR 17X24 6PK STRL BLUE (TOWEL DISPOSABLE) ×3 IMPLANT
TOWEL OR 17X26 10 PK STRL BLUE (TOWEL DISPOSABLE) ×3 IMPLANT
TUBE CONNECTING 12'X1/4 (SUCTIONS) ×1
TUBE CONNECTING 12X1/4 (SUCTIONS) ×1 IMPLANT
UNDERPAD 30X30 INCONTINENT (UNDERPADS AND DIAPERS) ×3 IMPLANT
WATER STERILE IRR 1000ML POUR (IV SOLUTION) ×1 IMPLANT

## 2015-03-10 NOTE — ED Notes (Signed)
Pt being transported via stretcher to OR.

## 2015-03-10 NOTE — Anesthesia Preprocedure Evaluation (Addendum)
Anesthesia Evaluation  Patient identified by MRN, date of birth, ID band Patient awake    Reviewed: Allergy & Precautions, NPO status , Patient's Chart, lab work & pertinent test results  History of Anesthesia Complications Negative for: history of anesthetic complications  Airway Mallampati: II  TM Distance: >3 FB Neck ROM: Full    Dental  (+) Teeth Intact, Dental Advisory Given   Pulmonary neg shortness of breath, neg sleep apnea, neg COPD, neg recent URI, Current Smoker, neg PE   breath sounds clear to auscultation       Cardiovascular negative cardio ROS   Rhythm:Regular     Neuro/Psych negative neurological ROS  negative psych ROS   GI/Hepatic negative GI ROS, Neg liver ROS,   Endo/Other  negative endocrine ROS  Renal/GU negative Renal ROS     Musculoskeletal   Abdominal   Peds  Hematology   Anesthesia Other Findings   Reproductive/Obstetrics                            Anesthesia Physical Anesthesia Plan  ASA: II  Anesthesia Plan: General   Post-op Pain Management:    Induction: Intravenous  Airway Management Planned: LMA  Additional Equipment: None  Intra-op Plan:   Post-operative Plan: Extubation in OR  Informed Consent: I have reviewed the patients History and Physical, chart, labs and discussed the procedure including the risks, benefits and alternatives for the proposed anesthesia with the patient or authorized representative who has indicated his/her understanding and acceptance.   Dental advisory given  Plan Discussed with: CRNA and Surgeon  Anesthesia Plan Comments:         Anesthesia Quick Evaluation

## 2015-03-10 NOTE — Anesthesia Postprocedure Evaluation (Signed)
Anesthesia Post Note  Patient: Andre Frost  Procedure(s) Performed: Procedure(s) (LRB): REVISION AMPUTATION OF RIGHT MIDDLE FINGER (Right)  Anesthesia type: General  Patient location: PACU  Post pain: Pain level controlled  Post assessment: Post-op Vital signs reviewed  Last Vitals: BP 127/98 mmHg  Pulse 78  Temp(Src) 36.1 C (Oral)  Resp 16  Ht  (1.803 m)  Wt 145 lb (65.772 kg)  BMI 20.23 kg/m2  SpO2 100%  Post vital signs: Reviewed  Level of consciousness: sedated  Complications: No apparent anesthesia complications

## 2015-03-10 NOTE — H&P (Signed)
Andre Frost is an 29 y.o. male.   Chief Complaint: Right middle finger partial amputation HPI: The patient is a very pleasant 29 year old male who sustained an on-the-job injury earlier this morning. The patient's employed at true [employer]. He was aerating a yard earlier today when his right hand became entrapped between the handle bar and a large limb resulting in a distal tip injury to the right middle finger. The patient proceeded to the emergency room setting where he was seen and evaluated and was noted to have an open distal tip injury with partial amputation of the volar soft tissue and exposed bony phalanx. Hand surgery was consulted.  Upon evaluation the patient is pleasant, he does have tenderness has expects to the distal tip. He is left-hand-dominant. Evaluation of the right hand once the dressings are removed showed that he has a nickel-sized defect about the distal tip and volar in nature with a partial amputation and exposed bony phalanx present. FDS FDP are intact extensor mechanism is intact. No other obvious injuries present  History reviewed. No pertinent past medical history.  History reviewed. No pertinent past surgical history.  Family History  Problem Relation Age of Onset  . Hypertension Mother   . Diabetes Mother   . Hypertension Father   . Lupus Sister   . Asthma Brother    Social History:  reports that he has been smoking Cigarettes.  He has been smoking about 0.25 packs per day. He has never used smokeless tobacco. He reports that he drinks alcohol. He reports that he does not use illicit drugs.  Allergies:  Allergies  Allergen Reactions  . Shellfish Allergy Anaphylaxis  . Sulfa Antibiotics Anaphylaxis, Hives and Swelling     (Not in a hospital admission)  No results found for this or any previous visit (from the past 48 hour(s)). Dg Finger Middle Right  03/10/2015   CLINICAL DATA:  Crush injury distal aspect RIGHT middle finger today,  pinched between a tree and a machine with which he was working, amputation injury, initial encounter  EXAM: RIGHT MIDDLE FINGER 2+V  COMPARISON:  RIGHT hand radiographs 04/20/2008  FINDINGS: Amputation at distal aspect of distal phalanx RIGHT middle finger.  Small bony fragments and missing fragments compatible with a displaced fracture involving the tuft of the distal phalanx.  Joint spaces preserved.  No additional fracture or dislocation.  IMPRESSION: Amputation through tuft of distal phalanx RIGHT middle finger.   Electronically Signed   By: Ulyses Southward M.D.   On: 03/10/2015 12:16    Review of Systems  Constitutional: Negative.   HENT: Negative.   Eyes: Negative.   Respiratory: Negative.   Cardiovascular: Negative.   Gastrointestinal: Negative.   Genitourinary: Negative.   Musculoskeletal:        Please see history of present illness  Skin: Negative.   Neurological: Negative.   Endo/Heme/Allergies: Negative.     Blood pressure 135/82, pulse 61, temperature 97.5 F (36.4 C), resp. rate 18, height 5\' 11"  (1.803 m), weight 65.772 kg (145 lb), SpO2 100 %. Physical Exam  Evaluation of the right middle finger as noted above in the history of present illness, the remaining portion of his hand exam shows that he has full digital range of motion wrist is nontender forearm is nontender. Contralateral extremity is without injury. .The patient is alert and oriented in no acute distress. The patient complains of pain in the affected upper extremity.  The patient is noted to have a normal HEENT  exam. Lung fields show equal chest expansion and no shortness of breath. Abdomen exam is nontender without distention. Lower extremity examination does not show any fracture dislocation or blood clot symptoms. Pelvis is stable and the neck and back are stable and nontender.  Assessment/Plan Right middle finger partial amputation about the distal tip secondary to an on-the-job injury. I have discussed  with the patient the need to proceed to the surgical arena for revision amputation. We have discussed multiple surgical options for revising the distal tip of which she understands. We will make an intraoperative decision and terms of the procedure. All questions were encouraged and answered. We are planning surgery for your upper extremity. The risk and benefits of surgery to include risk of bleeding, infection, anesthesia,  damage to normal structures and failure of the surgery to accomplish its intended goals of relieving symptoms and restoring function have been discussed in detail. With this in mind we plan to proceed. I have specifically discussed with the patient the pre-and postoperative regime and the dos and don'ts and risk and benefits in great detail. Risk and benefits of surgery also include risk of dystrophy(CRPS), chronic nerve pain, failure of the healing process to go onto completion and other inherent risks of surgery The relavent the pathophysiology of the disease/injury process, as well as the alternatives for treatment and postoperative course of action has been discussed in great detail with the patient who desires to proceed.  We will do everything in our power to help you (the patient) restore function to the upper extremity. It is a pleasure to see this patient today.  Shekera Beavers L 03/10/2015, 5:05 PM

## 2015-03-10 NOTE — Op Note (Signed)
See dictation [phone]  Status post I&D reconstruction right middle finger amputation with volar advancement flap and skin graft  Andre Frost M.D.

## 2015-03-10 NOTE — ED Provider Notes (Signed)
CSN: [id number]     Arrival date & time 03/10/15  1051 History   First MD Initiated Contact with Patient 03/10/15 1056     Chief Complaint  Patient presents with  . Finger Injury     (Consider location/radiation/quality/duration/timing/severity/associated sxs/prior Treatment) The history is provided by the patient and medical records.    29 year old male with no significant past medical history presenting here for right hand injury. Patient works for a [employer] and was using an Energy manager when he accidentally got his right middle finger caught between a tree branch and the handle of the machine. He states he jerked his hand back and amputated the distal aspect of his right middle finger. Bleeding is controlled on arrival. Tetanus is up-to-date, was given last year. Patient denies any direct dirt or foreign body exposure to wound. Patient is not currently on any type of anticoagulation. He is left-hand dominant. Patient has not eaten yet today, small amount of water earlier this morning.  History reviewed. No pertinent past medical history. History reviewed. No pertinent past surgical history. Family History  Problem Relation Age of Onset  . Hypertension Mother   . Diabetes Mother   . Hypertension Father   . Lupus Sister   . Asthma Brother    Social History  Substance Use Topics  . Smoking status: Current Every Day Smoker -- 0.25 packs/day    Types: Cigarettes  . Smokeless tobacco: Never Used  . Alcohol Use: Yes     Comment: socially    Review of Systems  Musculoskeletal: Positive for arthralgias.  Skin: Positive for wound.  All other systems reviewed and are negative.     Allergies  Shellfish allergy and Sulfa antibiotics  Home Medications   Prior to Admission medications   Medication Sig Start Date End Date Taking? Authorizing Provider  ibuprofen (ADVIL,MOTRIN) 800 MG tablet Take 1 tablet (800 mg total) by mouth 3 (three) times daily. 02/03/14   Fayrene Helper, PA-C  methocarbamol (ROBAXIN) 500 MG tablet Take 1 tablet (500 mg total) by mouth 2 (two) times daily. 02/03/14   Fayrene Helper, PA-C  oxyCODONE-acetaminophen (PERCOCET) 5-325 MG per tablet Take 2 tablets by mouth every 4 (four) hours as needed. 02/09/14   Mancel Bale, MD   BP 141/96 mmHg  Pulse 67  Temp(Src) 97.5 F (36.4 C)  Resp 18  Ht 5\' 11"  (1.803 m)  Wt 145 lb (65.772 kg)  BMI 20.23 kg/m2  SpO2 100%   Physical Exam  Constitutional: He is oriented to person, place, and time. He appears well-developed and well-nourished.  HENT:  Head: Normocephalic and atraumatic.  Mouth/Throat: Oropharynx is clear and moist.  Eyes: Conjunctivae and EOM are normal. Pupils are equal, round, and reactive to light.  Neck: Normal range of motion.  Cardiovascular: Normal rate, regular rhythm and normal heart sounds.   Pulmonary/Chest: Effort normal and breath sounds normal.  Abdominal: Soft. Bowel sounds are normal.  Musculoskeletal: Normal range of motion.  Right middle finger with amputation of palmar pad of distal phalanyx; nail and nailbed remains intact; no acute bony deformities of remainder of finger; normal sensation throughout; full flexion/extension maintained although with some pain; bleeding well controlled; radial pulse intact  Neurological: He is alert and oriented to person, place, and time.  Skin: Skin is warm and dry.  Psychiatric: He has a normal mood and affect.  Nursing note and vitals reviewed.      ED Course  Procedures (including critical care time) Labs Review Labs  Reviewed - No data to display  Imaging Review Dg Finger Middle Right  03/10/2015   CLINICAL DATA:  Crush injury distal aspect RIGHT middle finger today, pinched between a tree and a machine with which he was working, amputation injury, initial encounter  EXAM: RIGHT MIDDLE FINGER 2+V  COMPARISON:  RIGHT hand radiographs 04/20/2008  FINDINGS: Amputation at distal aspect of distal phalanx RIGHT middle  finger.  Small bony fragments and missing fragments compatible with a displaced fracture involving the tuft of the distal phalanx.  Joint spaces preserved.  No additional fracture or dislocation.  IMPRESSION: Amputation through tuft of distal phalanx RIGHT middle finger.   Electronically Signed   By: Ulyses Southward M.D.   On: 03/10/2015 12:16   I have personally reviewed and evaluated these images and lab results as part of my medical decision-making.   EKG Interpretation None      MDM   Final diagnoses:  Open fracture of distal phalanx of third finger of right hand, initial encounter  Amputation finger, initial encounter   29 year old male here with right hand injury from aerator machine handle.  No direct trauma with blade.  Patient has amputation of tuft of right middle finger, nail remains intact. He has other small areas of skin avulsion noted. He has normal sensation throughout finger, bleeding well controlled currently.  Vital signs stable. Tetanus is up-to-date. Will obtain x-ray. Patient given pain medication and Ancef.  X-ray revealing amputation through tuft of right distal phalanx of middle finger. No other injuries noted. Patient's pain is controlled currently. Case discussed with on call hand surgery, Dr. Amanda Pea-- will be down to see patient when finished in OR, will likely need revision amputation.  Patient updated on plan of care, additional pain meds ordered.  Garlon Hatchet, PA-C 03/10/15 1442  Lyndal Pulley, MD 03/10/15 [phone]

## 2015-03-10 NOTE — Transfer of Care (Signed)
Immediate Anesthesia Transfer of Care Note  Patient: Andre Frost  Procedure(s) Performed: Procedure(s): REVISION AMPUTATION OF RIGHT MIDDLE FINGER (Right)  Patient Location: PACU  Anesthesia Type:General  Level of Consciousness: responds to stimulation  Airway & Oxygen Therapy: Patient Spontanous Breathing and Patient connected to face mask oxygen  Post-op Assessment: Report given to RN and Post -op Vital signs reviewed and stable  Post vital signs: Reviewed and stable  Last Vitals:  Filed Vitals:   03/10/15 1958  BP:   Pulse: 76  Temp:   Resp: 12    Complications: No apparent anesthesia complications

## 2015-03-10 NOTE — Anesthesia Procedure Notes (Signed)
Procedure Name: LMA Insertion Date/Time: 03/10/2015 6:34 PM Performed by: Daiva Eves Pre-anesthesia Checklist: Patient identified, Emergency Drugs available, Suction available, Patient being monitored and Timeout performed Patient Re-evaluated:Patient Re-evaluated prior to inductionOxygen Delivery Method: Circle system utilized Preoxygenation: Pre-oxygenation with 100% oxygen Intubation Type: IV induction Ventilation: Mask ventilation without difficulty LMA: LMA inserted LMA Size: 5.0 Number of attempts: 1 Placement Confirmation: positive ETCO2,  CO2 detector and breath sounds checked- equal and bilateral Tube secured with: Tape Dental Injury: Teeth and Oropharynx as per pre-operative assessment

## 2015-03-10 NOTE — ED Notes (Signed)
Pt here with middle finger injury on the right hand , bleeding control  Pt able to move finger

## 2015-03-10 NOTE — Discharge Instructions (Signed)
Please keep your bandage clean and dry. You had a major reconstruction right middle finger.  Please call the office to see Dr. Amanda Pea in 10 days to 14 days  Please call for any questions  We recommend that you to take vitamin C 1000 mg a day to promote healing. We also recommend that if you require  pain medicine that you take a stool softener to prevent constipation as most pain medicines will have constipation side effects. We recommend either Peri-Colace or Senokot and recommend that you also consider adding MiraLAX to prevent the constipation affects from pain medicine if you are required to use them. These medicines are over the counter and maybe purchased at a local pharmacy. A cup of yogurt and a probiotic can also be helpful during the recovery process as the medicines can disrupt your intestinal environment. Keep bandage clean and dry.  Call for any problems.  No smoking.  Criteria for driving a car: you should be off your pain medicine for 7-8 hours, able to drive one handed(confident), thinking clearly and feeling able in your judgement to drive. Continue elevation as it will decrease swelling.  If instructed by MD move your fingers within the confines of the bandage/splint.  Use ice if instructed by your MD. Call immediately for any sudden loss of feeling in your hand/arm or change in functional abilities of the extremity.

## 2015-03-10 NOTE — ED Notes (Signed)
Patient transported to X-ray 

## 2015-03-10 NOTE — ED Notes (Signed)
Patient is resting comfortably. No complaints

## 2015-03-10 NOTE — ED Notes (Signed)
Saline soaked gauze placed over wound per [place] RN.

## 2015-03-10 NOTE — ED Notes (Signed)
Rosaland Lao, PA, in w/pt.

## 2015-03-11 ENCOUNTER — Encounter (HOSPITAL_COMMUNITY): Payer: Self-pay | Admitting: Orthopedic Surgery
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
## 2020-08-25 ENCOUNTER — Emergency Department (HOSPITAL_COMMUNITY): Payer: No Typology Code available for payment source

## 2020-08-25 ENCOUNTER — Other Ambulatory Visit: Payer: Self-pay

## 2020-08-25 ENCOUNTER — Encounter (HOSPITAL_COMMUNITY): Payer: Self-pay

## 2020-08-25 ENCOUNTER — Emergency Department (HOSPITAL_COMMUNITY)
Admission: EM | Admit: 2020-08-25 | Discharge: 2020-08-25 | Disposition: A | Discharge status: Home / Self Care | Payer: No Typology Code available for payment source | Attending: Emergency Medicine | Admitting: Emergency Medicine

## 2020-08-25 DIAGNOSIS — Y939 Activity, unspecified: Secondary | ICD-10-CM | POA: Diagnosis not present

## 2020-08-25 DIAGNOSIS — Y92410 Unspecified street and highway as the place of occurrence of the external cause: Secondary | ICD-10-CM | POA: Insufficient documentation

## 2020-08-25 DIAGNOSIS — M533 Sacrococcygeal disorders, not elsewhere classified: Secondary | ICD-10-CM | POA: Insufficient documentation

## 2020-08-25 DIAGNOSIS — S0990XA Unspecified injury of head, initial encounter: Secondary | ICD-10-CM | POA: Insufficient documentation

## 2020-08-25 DIAGNOSIS — Y999 Unspecified external cause status: Secondary | ICD-10-CM | POA: Diagnosis not present

## 2020-08-25 DIAGNOSIS — F17210 Nicotine dependence, cigarettes, uncomplicated: Secondary | ICD-10-CM | POA: Insufficient documentation

## 2020-08-25 MED ORDER — IBUPROFEN 800 MG PO TABS: 800.0000 mg | ORAL_TABLET | Freq: Three times a day (TID) | ORAL | 0 refills | Status: DC

## 2020-08-25 MED ORDER — KETOROLAC TROMETHAMINE 60 MG/2ML IM SOLN
60.0000 mg | Freq: Once | INTRAMUSCULAR | Status: AC
  Administered 2020-08-25: 60 mg via INTRAMUSCULAR
  Filled 2020-08-25: qty 2
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
## 2020-08-25 NOTE — Discharge Instructions (Signed)
Your x-ray today did not show any fractures.  You may have bruised your tailbone.  Continue to take Tylenol or ibuprofen for pain.  You may also attempt to use a heating pad or warm baths.  Please return to the ER for any new or worsening symptoms.  If you continue to have issues, please follow-up with your primary care doctor.  If you do not have a primary care doctor, please

## 2020-10-09 ENCOUNTER — Emergency Department (HOSPITAL_COMMUNITY): Payer: No Typology Code available for payment source

## 2020-10-09 ENCOUNTER — Encounter (HOSPITAL_COMMUNITY): Payer: Self-pay

## 2020-10-09 ENCOUNTER — Emergency Department (HOSPITAL_COMMUNITY)
Admission: EM | Admit: 2020-10-09 | Discharge: 2020-10-09 | Discharge status: Against Medical Advice | Payer: No Typology Code available for payment source | Attending: Emergency Medicine | Admitting: Emergency Medicine

## 2020-10-09 DIAGNOSIS — S6992XA Unspecified injury of left wrist, hand and finger(s), initial encounter: Secondary | ICD-10-CM | POA: Diagnosis present

## 2020-10-09 DIAGNOSIS — S62645A Nondisplaced fracture of proximal phalanx of left ring finger, initial encounter for closed fracture: Secondary | ICD-10-CM | POA: Insufficient documentation

## 2020-10-09 DIAGNOSIS — F17210 Nicotine dependence, cigarettes, uncomplicated: Secondary | ICD-10-CM | POA: Insufficient documentation

## 2020-10-09 DIAGNOSIS — S62647A Nondisplaced fracture of proximal phalanx of left little finger, initial encounter for closed fracture: Secondary | ICD-10-CM | POA: Insufficient documentation

## 2020-10-09 MED ORDER — HYDROCODONE-ACETAMINOPHEN 5-325 MG PO TABS
1.0000 | ORAL_TABLET | Freq: Four times a day (QID) | ORAL | Status: DC | PRN
  Administered 2020-10-09: 1 via ORAL
  Filled 2020-10-09: qty 1
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
## 2020-10-09 NOTE — ED Provider Notes (Signed)
[facility]-EMERGENCY DEPT Provider Note   CSN: [id number] Arrival date & time: 10/09/20  0535     History Chief Complaint  Patient presents with  . Hand Problem    Andre Frost is a 35 y.o. male.  Patient states he has an injury to his left hand.  He is left-hand dominant.  Does not recall events but thinks he fell because he has an abrasion on his forearm.  Swelling to his hand.  Pain to his fourth and fifth finger.  States his tetanus is up-to-date.        History reviewed. No pertinent past medical history.  There are no problems to display for this patient.   Past Surgical History:  Procedure Laterality Date  . AMPUTATION Right 03/10/2015   Procedure: REVISION AMPUTATION OF RIGHT MIDDLE FINGER;  Surgeon: Dominica Severin, MD;  Location: [facility] OR;  Service: Orthopedics;  Laterality: Right;       Family History  Problem Relation Age of Onset  . Hypertension Mother   . Diabetes Mother   . Hypertension Father   . Lupus Sister   . Asthma Brother     Social History   Tobacco Use  . Smoking status: Current Every Day Smoker    Packs/day: 0.25    Types: Cigarettes  . Smokeless tobacco: Never Used  Substance Use Topics  . Alcohol use: Yes    Comment: socially  . Drug use: No    Home Medications Prior to Admission medications   Medication Sig Start Date End Date Taking? Authorizing Provider  ibuprofen (ADVIL) 800 MG tablet Take 1 tablet (800 mg total) by mouth 3 (three) times daily. Patient not taking: Reported on 10/09/2020 08/25/20   Mare Ferrari, PA-C    Allergies    Shellfish allergy and Sulfa antibiotics  Review of Systems   Review of Systems  Constitutional: Negative for chills and fever.  HENT: Negative for rhinorrhea and sore throat.   Eyes: Negative for visual disturbance.  Respiratory: Negative for cough and shortness of breath.   Cardiovascular: Negative for chest pain and leg swelling.  Gastrointestinal: Negative for  abdominal pain, diarrhea, nausea and vomiting.  Genitourinary: Negative for dysuria.  Musculoskeletal: Positive for joint swelling. Negative for back pain and neck pain.  Skin: Positive for wound. Negative for rash.  Neurological: Negative for dizziness, light-headedness and headaches.  Hematological: Does not bruise/bleed easily.  Psychiatric/Behavioral: Negative for confusion.    Physical Exam Updated Vital Signs BP 140/89 (BP Location: Left Arm)   Pulse (!) 108   Temp 97.8 F (36.6 C) (Oral)   Resp 16   SpO2 99%   Physical Exam Vitals and nursing note reviewed.  Constitutional:      General: He is not in acute distress.    Appearance: Normal appearance. He is well-developed.  HENT:     Head: Normocephalic and atraumatic.  Eyes:     Extraocular Movements: Extraocular movements intact.     Conjunctiva/sclera: Conjunctivae normal.     Pupils: Pupils are equal, round, and reactive to light.  Cardiovascular:     Rate and Rhythm: Normal rate and regular rhythm.     Heart sounds: No murmur heard.   Pulmonary:     Effort: Pulmonary effort is normal. No respiratory distress.     Breath sounds: Normal breath sounds.  Abdominal:     Palpations: Abdomen is soft.     Tenderness: There is no abdominal tenderness.  Musculoskeletal:  General: Swelling, tenderness, deformity and signs of injury present.     Cervical back: Neck supple.     Comments: Multiple superficial abrasions to the left hand.  And a larger abrasion to the left forearm measuring about 4 x 8 cm.  The hand has swelling on the dorsum limited range of motion of the fourth finger.  But does have movement at the DIP PIP and MC.  Good cap refill.  Radial pulses 2+.  Tenderness to palpation to the fourth and fifth finger.  There is a deformity to the fourth finger.  Patient states that that is old.  The thumb has an old laceration that is healing.  Skin:    General: Skin is warm and dry.  Neurological:     General:  No focal deficit present.     Mental Status: He is alert and oriented to person, place, and time.     Cranial Nerves: No cranial nerve deficit.     Sensory: No sensory deficit.     Motor: No weakness.     ED Results / Procedures / Treatments   Labs (all labs ordered are listed, but only abnormal results are displayed) Labs Reviewed - No data to display  EKG None  Radiology DG Hand Complete Left  Result Date: 10/09/2020 CLINICAL DATA:  35 year old male status post fall with pain and lacerations. EXAM: LEFT HAND - COMPLETE 3+ VIEW COMPARISON:  Left hand radiographs 04/04/2006. FINDINGS: Mildly comminuted transverse fractures at the proximal metadiaphysis of both the 4th and 5th proximal phalanges. Relatively nondisplaced 5th phalanx fracture. There is mild radial and volar displacement of the 4th proximal phalanx fracture with mild ulnar angulation. Regional soft tissue swelling. Distal radius and ulna appear intact. Carpal bones appear intact and normally aligned. Metacarpals and other phalanges appear intact. IMPRESSION: 1. Mildly comminuted fractures of both the 4th and 5th proximal phalanx proximal meta-diaphyses. Mild displacement and angulation at the 4th phalanx. 2. No other acute fracture or dislocation identified about the left hand. Electronically Signed   By: Odessa Fleming M.D.   On: 10/09/2020 07:54    Procedures Procedures   Medications Ordered in ED Medications  HYDROcodone-acetaminophen (NORCO/VICODIN) 5-325 MG per tablet 1 tablet (1 tablet Oral Given 10/09/20 0901)    ED Course  I have reviewed the triage vital signs and the nursing notes.  Pertinent labs & imaging results that were available during my care of the patient were reviewed by me and considered in my medical decision making (see chart for details).    MDM Rules/Calculators/A&P                         X-ray shows fracture of the proximal phalanx of the fourth and fifth finger.  With a little bit of displacement  of the fourth finger.  No joint dislocation.  Ordered a short arm splint that came out to the DIP area.  Sling hydrocodone for pain.  Before could reassess the patient having the splint in place.  Patient eloped.  Patient was going to be referred to hand surgery.  Final Clinical Impression(s) / ED Diagnoses Final diagnoses:  Closed nondisplaced fracture of proximal phalanx of left ring finger, initial encounter  Closed nondisplaced fracture of proximal phalanx of left little finger, initial encounter    Rx / DC Orders ED Discharge Orders    None       Vanetta Mulders, MD 10/09/20 1002

## 2020-10-09 NOTE — ED Notes (Signed)
Ortho to bedside at this time, hand wrapped with gauze

## 2020-10-09 NOTE — ED Notes (Signed)
Patient not found in room after splint placed, checked bathrooms, patient is gone, Ruthy Dick MD informed

## 2020-10-09 NOTE — Progress Notes (Signed)
Orthopedic Tech Progress Note Patient Details:  Andre Frost Feb 03, 1986 [id number]  Ortho Devices Type of Ortho Device: Short arm splint,Arm sling Ortho Device/Splint Location: Left Hand Ortho Device/Splint Interventions: Application   Post Interventions Patient Tolerated: Well Instructions Provided: Care of device   Andre Frost 10/09/2020, 9:21 AM

## 2020-10-14 DIAGNOSIS — S62647A Nondisplaced fracture of proximal phalanx of left little finger, initial encounter for closed fracture: Secondary | ICD-10-CM | POA: Insufficient documentation

## 2020-10-15 ENCOUNTER — Other Ambulatory Visit: Payer: Self-pay | Admitting: Orthopedic Surgery

## 2020-10-15 ENCOUNTER — Other Ambulatory Visit: Payer: Self-pay

## 2020-10-15 ENCOUNTER — Encounter (HOSPITAL_BASED_OUTPATIENT_CLINIC_OR_DEPARTMENT_OTHER): Payer: Self-pay | Admitting: Orthopedic Surgery

## 2020-10-15 ENCOUNTER — Other Ambulatory Visit (HOSPITAL_COMMUNITY): Payer: No Typology Code available for payment source

## 2020-10-16 ENCOUNTER — Other Ambulatory Visit (HOSPITAL_COMMUNITY)
Admission: RE | Admit: 2020-10-16 | Discharge: 2020-10-16 | Disposition: A | Discharge status: Home / Self Care | Payer: No Typology Code available for payment source | Source: Ambulatory Visit | Attending: Orthopedic Surgery | Admitting: Orthopedic Surgery

## 2020-10-16 DIAGNOSIS — Z20822 Contact with and (suspected) exposure to covid-19: Secondary | ICD-10-CM | POA: Diagnosis not present

## 2020-10-16 DIAGNOSIS — Z01812 Encounter for preprocedural laboratory examination: Secondary | ICD-10-CM | POA: Diagnosis present

## 2020-10-16 NOTE — Progress Notes (Signed)
Sent message reminding pt to go for covid testing.  

## 2020-10-17 LAB — SARS CORONAVIRUS 2 (TAT 6-24 HRS): SARS Coronavirus 2: NEGATIVE

## 2020-10-19 ENCOUNTER — Ambulatory Visit (HOSPITAL_BASED_OUTPATIENT_CLINIC_OR_DEPARTMENT_OTHER)
Admission: RE | Admit: 2020-10-19 | Discharge: 2020-10-19 | Disposition: A | Discharge status: Home / Self Care | Payer: No Typology Code available for payment source | Attending: Orthopedic Surgery | Admitting: Orthopedic Surgery

## 2020-10-19 ENCOUNTER — Encounter (HOSPITAL_BASED_OUTPATIENT_CLINIC_OR_DEPARTMENT_OTHER)
Admission: RE | Disposition: A | Discharge status: Home / Self Care | Payer: Self-pay | Source: Home / Self Care | Attending: Orthopedic Surgery

## 2020-10-19 ENCOUNTER — Ambulatory Visit (HOSPITAL_BASED_OUTPATIENT_CLINIC_OR_DEPARTMENT_OTHER): Payer: No Typology Code available for payment source | Admitting: Certified Registered"

## 2020-10-19 ENCOUNTER — Encounter (HOSPITAL_BASED_OUTPATIENT_CLINIC_OR_DEPARTMENT_OTHER): Payer: Self-pay | Admitting: Orthopedic Surgery

## 2020-10-19 ENCOUNTER — Other Ambulatory Visit: Payer: Self-pay

## 2020-10-19 DIAGNOSIS — Z91013 Allergy to seafood: Secondary | ICD-10-CM | POA: Diagnosis not present

## 2020-10-19 DIAGNOSIS — Z825 Family history of asthma and other chronic lower respiratory diseases: Secondary | ICD-10-CM | POA: Diagnosis not present

## 2020-10-19 DIAGNOSIS — Z8249 Family history of ischemic heart disease and other diseases of the circulatory system: Secondary | ICD-10-CM | POA: Diagnosis not present

## 2020-10-19 DIAGNOSIS — Y939 Activity, unspecified: Secondary | ICD-10-CM | POA: Insufficient documentation

## 2020-10-19 DIAGNOSIS — Z8489 Family history of other specified conditions: Secondary | ICD-10-CM | POA: Diagnosis not present

## 2020-10-19 DIAGNOSIS — F17210 Nicotine dependence, cigarettes, uncomplicated: Secondary | ICD-10-CM | POA: Diagnosis not present

## 2020-10-19 DIAGNOSIS — S62615A Displaced fracture of proximal phalanx of left ring finger, initial encounter for closed fracture: Secondary | ICD-10-CM | POA: Insufficient documentation

## 2020-10-19 DIAGNOSIS — Z791 Long term (current) use of non-steroidal anti-inflammatories (NSAID): Secondary | ICD-10-CM | POA: Diagnosis not present

## 2020-10-19 DIAGNOSIS — Z833 Family history of diabetes mellitus: Secondary | ICD-10-CM | POA: Insufficient documentation

## 2020-10-19 DIAGNOSIS — Z882 Allergy status to sulfonamides status: Secondary | ICD-10-CM | POA: Diagnosis not present

## 2020-10-19 DIAGNOSIS — S62617A Displaced fracture of proximal phalanx of left little finger, initial encounter for closed fracture: Secondary | ICD-10-CM | POA: Diagnosis present

## 2020-10-19 HISTORY — PX: CLOSED REDUCTION FINGER WITH PERCUTANEOUS PINNING: SHX5612

## 2020-10-19 SURGERY — CLOSED REDUCTION, FINGER, WITH PERCUTANEOUS PINNING
Anesthesia: General | Site: Finger | Laterality: Left

## 2020-10-19 MED ORDER — MIDAZOLAM HCL 2 MG/2ML IJ SOLN
INTRAMUSCULAR | Status: AC
  Filled 2020-10-19: qty 2

## 2020-10-19 MED ORDER — CEFAZOLIN SODIUM-DEXTROSE 2-4 GM/100ML-% IV SOLN
INTRAVENOUS | Status: AC
  Filled 2020-10-19: qty 100

## 2020-10-19 MED ORDER — DEXAMETHASONE SODIUM PHOSPHATE 10 MG/ML IJ SOLN
INTRAMUSCULAR | Status: DC | PRN
  Administered 2020-10-19: 5 mg via INTRAVENOUS

## 2020-10-19 MED ORDER — ACETAMINOPHEN 500 MG PO TABS
1000.0000 mg | ORAL_TABLET | Freq: Once | ORAL | Status: AC
  Administered 2020-10-19: 1000 mg via ORAL

## 2020-10-19 MED ORDER — MIDAZOLAM HCL 5 MG/5ML IJ SOLN
INTRAMUSCULAR | Status: DC | PRN
  Administered 2020-10-19: 2 mg via INTRAVENOUS

## 2020-10-19 MED ORDER — ONDANSETRON HCL 4 MG/2ML IJ SOLN
INTRAMUSCULAR | Status: DC | PRN
  Administered 2020-10-19: 4 mg via INTRAVENOUS

## 2020-10-19 MED ORDER — LIDOCAINE HCL (CARDIAC) PF 100 MG/5ML IV SOSY
PREFILLED_SYRINGE | INTRAVENOUS | Status: DC | PRN
  Administered 2020-10-19: 60 mg via INTRAVENOUS

## 2020-10-19 MED ORDER — FENTANYL CITRATE (PF) 100 MCG/2ML IJ SOLN
INTRAMUSCULAR | Status: AC
  Filled 2020-10-19: qty 2

## 2020-10-19 MED ORDER — BUPIVACAINE HCL (PF) 0.25 % IJ SOLN
INTRAMUSCULAR | Status: DC | PRN
  Administered 2020-10-19: 8 mL

## 2020-10-19 MED ORDER — HYDROMORPHONE HCL 1 MG/ML IJ SOLN
0.5000 mg | Freq: Once | INTRAMUSCULAR | Status: AC
  Administered 2020-10-19: 0.5 mg via INTRAVENOUS

## 2020-10-19 MED ORDER — LACTATED RINGERS IV SOLN: INTRAVENOUS | Status: DC

## 2020-10-19 MED ORDER — OXYCODONE-ACETAMINOPHEN 5-325 MG PO TABS: ORAL_TABLET | ORAL | 0 refills | Status: AC

## 2020-10-19 MED ORDER — ACETAMINOPHEN 500 MG PO TABS
ORAL_TABLET | ORAL | Status: AC
  Filled 2020-10-19: qty 2

## 2020-10-19 MED ORDER — FENTANYL CITRATE (PF) 100 MCG/2ML IJ SOLN
25.0000 ug | INTRAMUSCULAR | Status: DC | PRN
  Administered 2020-10-19 (×2): 50 ug via INTRAVENOUS

## 2020-10-19 MED ORDER — FENTANYL CITRATE (PF) 100 MCG/2ML IJ SOLN
INTRAMUSCULAR | Status: DC | PRN
  Administered 2020-10-19 (×5): 25 ug via INTRAVENOUS

## 2020-10-19 MED ORDER — ONDANSETRON HCL 4 MG/2ML IJ SOLN
INTRAMUSCULAR | Status: AC
  Filled 2020-10-19: qty 2

## 2020-10-19 MED ORDER — CEFAZOLIN SODIUM-DEXTROSE 2-4 GM/100ML-% IV SOLN
2.0000 g | INTRAVENOUS | Status: AC
  Administered 2020-10-19: 2 g via INTRAVENOUS

## 2020-10-19 MED ORDER — HYDROCODONE-ACETAMINOPHEN 5-325 MG PO TABS: ORAL_TABLET | ORAL | 0 refills | Status: DC

## 2020-10-19 MED ORDER — DEXAMETHASONE SODIUM PHOSPHATE 10 MG/ML IJ SOLN
INTRAMUSCULAR | Status: AC
  Filled 2020-10-19: qty 1

## 2020-10-19 MED ORDER — KETOROLAC TROMETHAMINE 30 MG/ML IJ SOLN
30.0000 mg | Freq: Once | INTRAMUSCULAR | Status: AC
  Administered 2020-10-19: 30 mg via INTRAVENOUS

## 2020-10-19 MED ORDER — DEXMEDETOMIDINE HCL 200 MCG/2ML IV SOLN
INTRAVENOUS | Status: DC | PRN
  Administered 2020-10-19: 12 ug via INTRAVENOUS

## 2020-10-19 MED ORDER — HYDROMORPHONE HCL 1 MG/ML IJ SOLN
INTRAMUSCULAR | Status: AC
  Filled 2020-10-19: qty 0.5

## 2020-10-19 MED ORDER — LIDOCAINE 2% (20 MG/ML) 5 ML SYRINGE
INTRAMUSCULAR | Status: AC
  Filled 2020-10-19: qty 5

## 2020-10-19 MED ORDER — AMISULPRIDE (ANTIEMETIC) 5 MG/2ML IV SOLN: 10.0000 mg | Freq: Once | INTRAVENOUS | Status: DC | PRN

## 2020-10-19 MED ORDER — PROPOFOL 10 MG/ML IV BOLUS
INTRAVENOUS | Status: DC | PRN
  Administered 2020-10-19: 200 mg via INTRAVENOUS

## 2020-10-19 MED ORDER — PROPOFOL 10 MG/ML IV BOLUS
INTRAVENOUS | Status: AC
  Filled 2020-10-19: qty 20

## 2020-10-19 MED ORDER — KETOROLAC TROMETHAMINE 30 MG/ML IJ SOLN
INTRAMUSCULAR | Status: AC
  Filled 2020-10-19: qty 1

## 2020-10-19 SURGICAL SUPPLY — 44 items
APL PRP STRL LF DISP 70% ISPRP (MISCELLANEOUS) ×1
BLADE SURG 15 STRL LF DISP TIS (BLADE) ×2 IMPLANT
BLADE SURG 15 STRL SS (BLADE) ×2
BNDG CMPR 9X4 STRL LF SNTH (GAUZE/BANDAGES/DRESSINGS) ×1
BNDG ELASTIC 2X5.8 VLCR STR LF (GAUZE/BANDAGES/DRESSINGS) IMPLANT
BNDG ELASTIC 3X5.8 VLCR STR LF (GAUZE/BANDAGES/DRESSINGS) ×2 IMPLANT
BNDG ESMARK 4X9 LF (GAUZE/BANDAGES/DRESSINGS) ×2 IMPLANT
BNDG GAUZE ELAST 4 BULKY (GAUZE/BANDAGES/DRESSINGS) ×2 IMPLANT
CHLORAPREP W/TINT 26 (MISCELLANEOUS) ×2 IMPLANT
CORD BIPOLAR FORCEPS 12FT (ELECTRODE) IMPLANT
COVER BACK TABLE 60X90IN (DRAPES) ×2 IMPLANT
COVER MAYO STAND STRL (DRAPES) ×2 IMPLANT
COVER WAND RF STERILE (DRAPES) IMPLANT
CUFF TOURN SGL QUICK 18X4 (TOURNIQUET CUFF) ×2 IMPLANT
DRAPE EXTREMITY T 121X128X90 (DISPOSABLE) ×2 IMPLANT
DRAPE OEC MINIVIEW 54X84 (DRAPES) ×2 IMPLANT
DRAPE SURG 17X23 STRL (DRAPES) ×2 IMPLANT
GAUZE SPONGE 4X4 12PLY STRL (GAUZE/BANDAGES/DRESSINGS) ×2 IMPLANT
GAUZE XEROFORM 1X8 LF (GAUZE/BANDAGES/DRESSINGS) ×2 IMPLANT
GLOVE SRG 8 PF TXTR STRL LF DI (GLOVE) ×1 IMPLANT
GLOVE SURG ENC MOIS LTX SZ7.5 (GLOVE) ×2 IMPLANT
GLOVE SURG ORTHO LTX SZ8 (GLOVE) IMPLANT
GLOVE SURG UNDER POLY LF SZ8 (GLOVE) ×2
GLOVE SURG UNDER POLY LF SZ8.5 (GLOVE) IMPLANT
GOWN STRL REUS W/ TWL LRG LVL3 (GOWN DISPOSABLE) ×1 IMPLANT
GOWN STRL REUS W/TWL LRG LVL3 (GOWN DISPOSABLE) ×4
NDL HYPO 25X1 1.5 SAFETY (NEEDLE) IMPLANT
NEEDLE HYPO 25X1 1.5 SAFETY (NEEDLE) ×2 IMPLANT
NS IRRIG 1000ML POUR BTL (IV SOLUTION) ×2 IMPLANT
PACK BASIN DAY SURGERY FS (CUSTOM PROCEDURE TRAY) ×2 IMPLANT
PAD CAST 3X4 CTTN HI CHSV (CAST SUPPLIES) IMPLANT
PAD CAST 4YDX4 CTTN HI CHSV (CAST SUPPLIES) IMPLANT
PADDING CAST COTTON 3X4 STRL (CAST SUPPLIES)
PADDING CAST COTTON 4X4 STRL (CAST SUPPLIES) ×2
SLEEVE SCD COMPRESS KNEE MED (STOCKING) IMPLANT
SPLINT PLASTER CAST XFAST 3X15 (CAST SUPPLIES) IMPLANT
SPLINT PLASTER XTRA FASTSET 3X (CAST SUPPLIES) ×20
STOCKINETTE 4X48 STRL (DRAPES) ×2 IMPLANT
SUT ETHILON 3 0 PS 1 (SUTURE) IMPLANT
SUT ETHILON 4 0 PS 2 18 (SUTURE) IMPLANT
SYR BULB EAR ULCER 3OZ GRN STR (SYRINGE) IMPLANT
SYR CONTROL 10ML LL (SYRINGE) ×1 IMPLANT
TOWEL GREEN STERILE FF (TOWEL DISPOSABLE) ×3 IMPLANT
UNDERPAD 30X36 HEAVY ABSORB (UNDERPADS AND DIAPERS) ×2 IMPLANT

## 2020-10-19 NOTE — Transfer of Care (Signed)
Immediate Anesthesia Transfer of Care Note  Patient: Andre Frost  Procedure(s) Performed: CLOSED REDUCTION PERCUTANEOUS PINNING,LEFT RING AND LEFT SMALL PROXIMAL PHALANGES (Left Finger)  Patient Location: PACU  Anesthesia Type:General and Regional  Level of Consciousness: awake, alert  and oriented  Airway & Oxygen Therapy: Patient Spontanous Breathing and Patient connected to face mask oxygen  Post-op Assessment: Report given to RN and Post -op Vital signs reviewed and stable  Post vital signs: Reviewed and stable  Last Vitals:  Vitals Value Taken Time  BP    Temp    Pulse 99 10/19/20 1513  Resp 17 10/19/20 1513  SpO2 100 % 10/19/20 1513  Vitals shown include unvalidated device data.  Last Pain:  Vitals:   10/19/20 1244  TempSrc: Oral  PainSc: 5       Patients Stated Pain Goal: 6 (10/19/20 1244)  Complications: No complications documented.

## 2020-10-19 NOTE — Discharge Instructions (Addendum)
[facility] Instructions Hand Surgery  Wound Care: Keep your hand elevated above the level of your heart.  Do not allow it to dangle by your side.  Keep the dressing dry and do not remove it unless your doctor advises you to do so.  He will usually change it at the time of your post-op visit.  Moving your fingers is advised to stimulate circulation but will depend on the site of your surgery.  If you have a splint applied, your doctor will advise you regarding movement.  Activity: Do not drive or operate machinery today.  Rest today and then you may return to your normal activity and work as indicated by your physician.  Diet:  Drink liquids today or eat a light diet.  You may resume a regular diet tomorrow.    General expectations: Pain for two to three days. Fingers may become slightly swollen.  Call your doctor if any of the following occur: Severe pain not relieved by pain medication. Elevated temperature. Dressing soaked with blood. Inability to move fingers. White or bluish color to fingers.   Post Anesthesia Home Care Instructions  Activity: Get plenty of rest for the remainder of the day. A responsible individual must stay with you for 24 hours following the procedure.  For the next 24 hours, DO NOT: -Drive a car -Advertising copywriter -Drink alcoholic beverages -Take any medication unless instructed by your physician -Make any legal decisions or sign important papers.  Meals: Start with liquid foods such as gelatin or soup. Progress to regular foods as tolerated. Avoid greasy, spicy, heavy foods. If nausea and/or vomiting occur, drink only clear liquids until the nausea and/or vomiting subsides. Call your physician if vomiting continues.  Special Instructions/Symptoms: Your throat may feel dry or sore from the anesthesia or the breathing tube placed in your throat during surgery. If this causes discomfort, gargle with warm salt water. The discomfort should disappear within  24 hours.  If you had a scopolamine patch placed behind your ear for the management of post- operative nausea and/or vomiting:  1. The medication in the patch is effective for 72 hours, after which it should be removed.  Wrap patch in a tissue and discard in the trash. Wash hands thoroughly with soap and water. 2. You may remove the patch earlier than 72 hours if you experience unpleasant side effects which may include dry mouth, dizziness or visual disturbances. 3. Avoid touching the patch. Wash your hands with soap and water after contact with the patch.    No Tylenol until 7:20PM.
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
## 2020-10-19 NOTE — Anesthesia Preprocedure Evaluation (Signed)
Anesthesia Evaluation  Patient identified by MRN, date of birth, ID band Patient awake    Reviewed: Allergy & Precautions, NPO status , Patient's Chart, lab work & pertinent test results  Airway Mallampati: II  TM Distance: >3 FB Neck ROM: Full    Dental  (+) Dental Advisory Given   Pulmonary Current Smoker,    breath sounds clear to auscultation       Cardiovascular negative cardio ROS   Rhythm:Regular Rate:Normal     Neuro/Psych negative neurological ROS     GI/Hepatic negative GI ROS, Neg liver ROS,   Endo/Other  negative endocrine ROS  Renal/GU negative Renal ROS     Musculoskeletal   Abdominal   Peds  Hematology negative hematology ROS (+)   Anesthesia Other Findings   Reproductive/Obstetrics                             Anesthesia Physical Anesthesia Plan  ASA: I  Anesthesia Plan: General   Post-op Pain Management:    Induction: Intravenous  PONV Risk Score and Plan: 1 and Dexamethasone, Ondansetron and Treatment may vary due to age or medical condition  Airway Management Planned: LMA  Additional Equipment:   Intra-op Plan:   Post-operative Plan: Extubation in OR  Informed Consent: I have reviewed the patients History and Physical, chart, labs and discussed the procedure including the risks, benefits and alternatives for the proposed anesthesia with the patient or authorized representative who has indicated his/her understanding and acceptance.     Dental advisory given  Plan Discussed with: CRNA  Anesthesia Plan Comments:         Anesthesia Quick Evaluation
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
## 2020-10-19 NOTE — Anesthesia Procedure Notes (Signed)
Procedure Name: LMA Insertion Date/Time: 10/19/2020 2:32 PM Performed by: Lauralyn Primes, CRNA Pre-anesthesia Checklist: Patient identified, Emergency Drugs available, Suction available and Patient being monitored Patient Re-evaluated:Patient Re-evaluated prior to induction Oxygen Delivery Method: Circle system utilized Preoxygenation: Pre-oxygenation with 100% oxygen Induction Type: IV induction Ventilation: Mask ventilation without difficulty LMA: LMA inserted LMA Size: 5.0 Number of attempts: 1 Airway Equipment and Method: Bite block Placement Confirmation: positive ETCO2 Tube secured with: Tape Dental Injury: Teeth and Oropharynx as per pre-operative assessment

## 2020-10-20 ENCOUNTER — Encounter (HOSPITAL_BASED_OUTPATIENT_CLINIC_OR_DEPARTMENT_OTHER): Payer: Self-pay | Admitting: Orthopedic Surgery

## 2020-10-21 NOTE — Anesthesia Postprocedure Evaluation (Signed)
Anesthesia Post Note  Patient: Andre Frost  Procedure(s) Performed: CLOSED REDUCTION PERCUTANEOUS PINNING,LEFT RING AND LEFT SMALL PROXIMAL PHALANGES (Left Finger)     Patient location during evaluation: PACU Anesthesia Type: General Level of consciousness: awake and alert Pain management: pain level controlled Vital Signs Assessment: post-procedure vital signs reviewed and stable Respiratory status: spontaneous breathing, nonlabored ventilation, respiratory function stable and patient connected to nasal cannula oxygen Cardiovascular status: blood pressure returned to baseline and stable Postop Assessment: no apparent nausea or vomiting Anesthetic complications: no   No complications documented.  Last Vitals:  Vitals:   10/19/20 1600 10/19/20 1620  BP: (!) 150/104 (!) 146/105  Pulse: 83 99  Resp: 14 16  Temp:  36.6 C  SpO2: 99% 99%    Last Pain:  Vitals:   10/19/20 1600  TempSrc:   PainSc: 7                  Kennieth Rad

## 2021-06-15 ENCOUNTER — Encounter: Payer: Self-pay | Admitting: Emergency Medicine

## 2021-06-15 ENCOUNTER — Emergency Department (HOSPITAL_COMMUNITY): Payer: No Typology Code available for payment source

## 2021-06-15 ENCOUNTER — Other Ambulatory Visit: Payer: Self-pay

## 2021-06-15 ENCOUNTER — Inpatient Hospital Stay (HOSPITAL_COMMUNITY)
Admission: EM | Admit: 2021-06-15 | Discharge: 2021-06-18 | DRG: 605 | Disposition: A | Discharge status: Home / Self Care | Payer: No Typology Code available for payment source | Attending: Surgery | Admitting: Surgery

## 2021-06-15 ENCOUNTER — Encounter (HOSPITAL_COMMUNITY): Payer: Self-pay

## 2021-06-15 DIAGNOSIS — S61411A Laceration without foreign body of right hand, initial encounter: Secondary | ICD-10-CM | POA: Diagnosis present

## 2021-06-15 DIAGNOSIS — W260XXA Contact with knife, initial encounter: Secondary | ICD-10-CM | POA: Diagnosis present

## 2021-06-15 DIAGNOSIS — Z4682 Encounter for fitting and adjustment of non-vascular catheter: Secondary | ICD-10-CM

## 2021-06-15 DIAGNOSIS — Z20822 Contact with and (suspected) exposure to covid-19: Secondary | ICD-10-CM | POA: Diagnosis present

## 2021-06-15 DIAGNOSIS — R0602 Shortness of breath: Secondary | ICD-10-CM | POA: Diagnosis present

## 2021-06-15 DIAGNOSIS — S71111A Laceration without foreign body, right thigh, initial encounter: Secondary | ICD-10-CM | POA: Diagnosis present

## 2021-06-15 DIAGNOSIS — Z91013 Allergy to seafood: Secondary | ICD-10-CM

## 2021-06-15 DIAGNOSIS — T07XXXA Unspecified multiple injuries, initial encounter: Secondary | ICD-10-CM

## 2021-06-15 DIAGNOSIS — T1490XA Injury, unspecified, initial encounter: Secondary | ICD-10-CM

## 2021-06-15 DIAGNOSIS — S270XXA Traumatic pneumothorax, initial encounter: Secondary | ICD-10-CM | POA: Diagnosis present

## 2021-06-15 DIAGNOSIS — S31010A Laceration without foreign body of lower back and pelvis without penetration into retroperitoneum, initial encounter: Secondary | ICD-10-CM | POA: Diagnosis present

## 2021-06-15 DIAGNOSIS — Z882 Allergy status to sulfonamides status: Secondary | ICD-10-CM

## 2021-06-15 DIAGNOSIS — F17210 Nicotine dependence, cigarettes, uncomplicated: Secondary | ICD-10-CM | POA: Diagnosis present

## 2021-06-15 DIAGNOSIS — Z23 Encounter for immunization: Secondary | ICD-10-CM

## 2021-06-15 DIAGNOSIS — T148XXA Other injury of unspecified body region, initial encounter: Secondary | ICD-10-CM

## 2021-06-15 DIAGNOSIS — J939 Pneumothorax, unspecified: Secondary | ICD-10-CM

## 2021-06-15 LAB — RESP PANEL BY RT-PCR (FLU A&B, COVID) ARPGX2
Influenza A by PCR: NEGATIVE
Influenza B by PCR: NEGATIVE
SARS Coronavirus 2 by RT PCR: NEGATIVE

## 2021-06-15 LAB — ETHANOL: Alcohol, Ethyl (B): <10 mg/dL (ref <10)

## 2021-06-15 LAB — I-STAT CHEM 8, ED
BUN: 6 mg/dL (ref 6–20)
Calcium, Ion: 1 mmol/L — ABNORMAL LOW (ref 1.15–1.40)
Chloride: 106 mmol/L (ref 98–111)
Creatinine, Ser: 1 mg/dL (ref 0.61–1.24)
Glucose, Bld: 116 mg/dL — ABNORMAL HIGH (ref 70–99)
HCT: 44 % (ref 39.0–52.0)
Hemoglobin: 15 g/dL (ref 13.0–17.0)
Potassium: 4 mmol/L (ref 3.5–5.1)
Sodium: 138 mmol/L (ref 135–145)
TCO2: 22 mmol/L (ref 22–32)

## 2021-06-15 LAB — COMPREHENSIVE METABOLIC PANEL
ALT: 25 U/L (ref 0–44)
AST: 24 U/L (ref 15–41)
Albumin: 3.7 g/dL (ref 3.5–5.0)
Alkaline Phosphatase: 39 U/L (ref 38–126)
Anion gap: 10 (ref 5–15)
BUN: 8 mg/dL (ref 6–20)
CO2: 20 mmol/L — ABNORMAL LOW (ref 22–32)
Calcium: 8.4 mg/dL — ABNORMAL LOW (ref 8.9–10.3)
Chloride: 104 mmol/L (ref 98–111)
Creatinine, Ser: 0.98 mg/dL (ref 0.61–1.24)
GFR, Estimated: >60 mL/min (ref >60)
Glucose, Bld: 124 mg/dL — ABNORMAL HIGH (ref 70–99)
Potassium: 4.3 mmol/L (ref 3.5–5.1)
Sodium: 134 mmol/L — ABNORMAL LOW (ref 135–145)
Total Bilirubin: 1.3 mg/dL — ABNORMAL HIGH (ref 0.3–1.2)
Total Protein: 5.9 g/dL — ABNORMAL LOW (ref 6.5–8.1)

## 2021-06-15 LAB — PROTIME-INR
INR: 0.9 (ref 0.8–1.2)
Prothrombin Time: 12.5 seconds (ref 11.4–15.2)

## 2021-06-15 LAB — SAMPLE TO BLOOD BANK

## 2021-06-15 LAB — CBC
HCT: 39.3 % (ref 39.0–52.0)
Hemoglobin: 13.2 g/dL (ref 13.0–17.0)
MCH: 30.6 pg (ref 26.0–34.0)
MCHC: 33.6 g/dL (ref 30.0–36.0)
MCV: 91 fL (ref 80.0–100.0)
Platelets: 320 10*3/uL (ref 150–400)
RBC: 4.32 MIL/uL (ref 4.22–5.81)
RDW: 13.6 % (ref 11.5–15.5)
WBC: 26.5 10*3/uL — ABNORMAL HIGH (ref 4.0–10.5)
nRBC: 0 % (ref 0.0–0.2)

## 2021-06-15 LAB — LACTIC ACID, PLASMA: Lactic Acid, Venous: 3.2 mmol/L (ref 0.5–1.9)

## 2021-06-15 MED ORDER — FENTANYL CITRATE PF 50 MCG/ML IJ SOSY
50.0000 ug | PREFILLED_SYRINGE | Freq: Once | INTRAMUSCULAR | Status: AC
  Administered 2021-06-15: 18:00:00 50 ug via INTRAVENOUS
  Filled 2021-06-15: qty 1

## 2021-06-15 MED ORDER — IOHEXOL 350 MG/ML SOLN
85.0000 mL | Freq: Once | INTRAVENOUS | Status: AC | PRN
  Administered 2021-06-15: 18:00:00 85 mL via INTRAVENOUS

## 2021-06-15 MED ORDER — ONDANSETRON 4 MG PO TBDP: 4.0000 mg | ORAL_TABLET | Freq: Four times a day (QID) | ORAL | Status: DC | PRN

## 2021-06-15 MED ORDER — FENTANYL CITRATE PF 50 MCG/ML IJ SOSY
50.0000 ug | PREFILLED_SYRINGE | Freq: Once | INTRAMUSCULAR | Status: AC
  Administered 2021-06-15: 20:00:00 50 ug via INTRAVENOUS
  Filled 2021-06-15: qty 1

## 2021-06-15 MED ORDER — METOPROLOL TARTRATE 5 MG/5ML IV SOLN: 5.0000 mg | Freq: Four times a day (QID) | INTRAVENOUS | Status: DC | PRN

## 2021-06-15 MED ORDER — FENTANYL CITRATE PF 50 MCG/ML IJ SOSY
50.0000 ug | PREFILLED_SYRINGE | Freq: Once | INTRAMUSCULAR | Status: AC
  Administered 2021-06-15: 18:00:00 50 ug via INTRAVENOUS

## 2021-06-15 MED ORDER — DOCUSATE SODIUM 100 MG PO CAPS
100.0000 mg | ORAL_CAPSULE | Freq: Two times a day (BID) | ORAL | Status: DC
  Administered 2021-06-15 – 2021-06-17 (×5): 100 mg via ORAL
  Filled 2021-06-15 (×5): qty 1

## 2021-06-15 MED ORDER — FENTANYL CITRATE (PF) 100 MCG/2ML IJ SOLN
INTRAMUSCULAR | Status: AC
  Filled 2021-06-15: qty 2

## 2021-06-15 MED ORDER — LIDOCAINE-EPINEPHRINE (PF) 2 %-1:200000 IJ SOLN
10.0000 mL | Freq: Once | INTRAMUSCULAR | Status: AC
  Administered 2021-06-15: 10 mL via INTRADERMAL

## 2021-06-15 MED ORDER — SODIUM CHLORIDE 0.9 % IV BOLUS
1000.0000 mL | Freq: Once | INTRAVENOUS | Status: AC
  Administered 2021-06-15: 17:00:00 1000 mL via INTRAVENOUS

## 2021-06-15 MED ORDER — CEFAZOLIN SODIUM-DEXTROSE 2-4 GM/100ML-% IV SOLN
2.0000 g | Freq: Once | INTRAVENOUS | Status: AC
  Administered 2021-06-15: 18:00:00 2 g via INTRAVENOUS
  Filled 2021-06-15: qty 100

## 2021-06-15 MED ORDER — MORPHINE SULFATE (PF) 2 MG/ML IV SOLN
2.0000 mg | INTRAVENOUS | Status: DC | PRN
  Administered 2021-06-16 – 2021-06-18 (×5): 4 mg via INTRAVENOUS
  Filled 2021-06-15 (×5): qty 2

## 2021-06-15 MED ORDER — TETANUS-DIPHTH-ACELL PERTUSSIS 5-2.5-18.5 LF-MCG/0.5 IM SUSY
0.5000 mL | PREFILLED_SYRINGE | Freq: Once | INTRAMUSCULAR | Status: AC
  Administered 2021-06-15: 18:00:00 0.5 mL via INTRAMUSCULAR
  Filled 2021-06-15: qty 0.5

## 2021-06-15 MED ORDER — ENOXAPARIN SODIUM 30 MG/0.3ML IJ SOSY
30.0000 mg | PREFILLED_SYRINGE | Freq: Two times a day (BID) | INTRAMUSCULAR | Status: DC
  Administered 2021-06-16 – 2021-06-17 (×4): 30 mg via SUBCUTANEOUS
  Filled 2021-06-15 (×4): qty 0.3

## 2021-06-15 MED ORDER — DEXTROSE-NACL 5-0.45 % IV SOLN: INTRAVENOUS | Status: DC

## 2021-06-15 MED ORDER — ACETAMINOPHEN 325 MG PO TABS: 650.0000 mg | ORAL_TABLET | ORAL | Status: DC | PRN

## 2021-06-15 MED ORDER — OXYCODONE HCL 5 MG PO TABS
5.0000 mg | ORAL_TABLET | ORAL | Status: DC | PRN
  Administered 2021-06-15 – 2021-06-16 (×2): 5 mg via ORAL
  Filled 2021-06-15 (×2): qty 1

## 2021-06-15 MED ORDER — ONDANSETRON HCL 4 MG/2ML IJ SOLN
4.0000 mg | Freq: Four times a day (QID) | INTRAMUSCULAR | Status: DC | PRN
  Administered 2021-06-16: 08:00:00 4 mg via INTRAVENOUS
  Filled 2021-06-15: qty 2

## 2021-06-15 MED ORDER — MORPHINE SULFATE (PF) 4 MG/ML IV SOLN
4.0000 mg | Freq: Once | INTRAVENOUS | Status: AC
  Administered 2021-06-15: 21:00:00 4 mg via INTRAVENOUS
  Filled 2021-06-15: qty 1

## 2021-06-15 NOTE — Procedures (Signed)
Chest tube insertion  Date/Time: 06/15/2021 5:49 PM Performed by: Violeta Gelinas, MD Authorized by: Violeta Gelinas, MD   Consent:    Consent obtained:  Verbal and emergent situation   Consent given by:  Patient Pre-procedure details:    Skin preparation:  ChloraPrep Anesthesia (see MAR for exact dosages):    Anesthesia method:  Local infiltration Procedure details:    Placement location:  L lateral   Tube size (Fr):  Minicatheter   Technique: Seldinger     Ultrasound guidance: no     Tube connected to:  Suction   Drainage characteristics:  Air only   Dressing:  4x4 sterile gauze Post-procedure details:    Patient tolerance of procedure:  Tolerated well, no immediate complications Violeta Gelinas, MD, MPH, FACS Please use [email or web address] to contact on call provider
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
## 2021-06-15 NOTE — ED Notes (Signed)
Time out performed by MD Janee Morn for emergent L sided chest tube placement d/t pneumothorax on bedside CXR
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
## 2021-06-15 NOTE — Progress Notes (Signed)
°   06/15/21 1715  Clinical Encounter Type  Visited With Patient not available  Visit Type Initial;Trauma  Referral From Nurse  Consult/Referral To Chaplain    Chaplain Tery Sanfilippo responded. The medical team is attending to the patient. Donnajean Lopes checked in the unit secretary, Jeannett Senior. There is no support person here at this time. Advise that the Chaplain remains available for follow-up spiritual and emotional support as needed. This note was prepared by Deneen Harts, M.Div..  For questions please contact by phone [phone].
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
## 2021-06-15 NOTE — Procedures (Signed)
Laceration Repair  Date/Time: 06/15/2021 6:38 PM Performed by: Violeta Gelinas, MD Authorized by: Violeta Gelinas, MD   Consent:    Consent obtained:  Verbal and emergent situation   Consent given by:  Patient   Risks, benefits, and alternatives were discussed: yes   Universal protocol:    Procedure explained and questions answered to patient or proxy's satisfaction: yes     Relevant documents present and verified: yes     Test results available: yes     Imaging studies available: yes     Required blood products, implants, devices, and special equipment available: yes     Immediately prior to procedure, a time out was called: yes     Patient identity confirmed:  Verbally with patient Anesthesia:    Anesthesia method:  Local infiltration Laceration details:    Location: L back 3cm, R thigh 2cm, 3cm, 3cm. Pre-procedure details:    Preparation:  Patient was prepped and draped in usual sterile fashion Exploration:    Hemostasis achieved with:  Direct pressure   Imaging outcome: foreign body not noted   Treatment:    Area cleansed with:  Povidone-iodine   Amount of cleaning:  Standard   Irrigation solution:  Sterile saline   Debridement:  None Skin repair:    Repair method:  Staples Repair type:    Repair type:  Simple Post-procedure details:    Dressing:  Sterile dressing   Procedure completion:  Tolerated Violeta Gelinas, MD, MPH, FACS Please use [email or web address] to contact on call provider

## 2021-06-16 ENCOUNTER — Inpatient Hospital Stay (HOSPITAL_COMMUNITY): Payer: No Typology Code available for payment source

## 2021-06-16 LAB — BASIC METABOLIC PANEL
Anion gap: 8 (ref 5–15)
BUN: 6 mg/dL (ref 6–20)
CO2: 23 mmol/L (ref 22–32)
Calcium: 8.4 mg/dL — ABNORMAL LOW (ref 8.9–10.3)
Chloride: 101 mmol/L (ref 98–111)
Creatinine, Ser: 0.96 mg/dL (ref 0.61–1.24)
GFR, Estimated: >60 mL/min (ref >60)
Glucose, Bld: 115 mg/dL — ABNORMAL HIGH (ref 70–99)
Potassium: 3.7 mmol/L (ref 3.5–5.1)
Sodium: 132 mmol/L — ABNORMAL LOW (ref 135–145)

## 2021-06-16 LAB — CREATININE, SERUM
Creatinine, Ser: 0.92 mg/dL (ref 0.61–1.24)
GFR, Estimated: >60 mL/min (ref >60)

## 2021-06-16 LAB — CBC
HCT: 39.3 % (ref 39.0–52.0)
HCT: 39.3 % (ref 39.0–52.0)
Hemoglobin: 13.2 g/dL (ref 13.0–17.0)
Hemoglobin: 13 g/dL (ref 13.0–17.0)
MCH: 30.7 pg (ref 26.0–34.0)
MCH: 29.8 pg (ref 26.0–34.0)
MCHC: 33.6 g/dL (ref 30.0–36.0)
MCHC: 33.1 g/dL (ref 30.0–36.0)
MCV: 91.4 fL (ref 80.0–100.0)
MCV: 90.1 fL (ref 80.0–100.0)
Platelets: 293 10*3/uL (ref 150–400)
Platelets: 310 10*3/uL (ref 150–400)
RBC: 4.3 MIL/uL (ref 4.22–5.81)
RBC: 4.36 MIL/uL (ref 4.22–5.81)
RDW: 13.6 % (ref 11.5–15.5)
RDW: 13.5 % (ref 11.5–15.5)
WBC: 20.1 10*3/uL — ABNORMAL HIGH (ref 4.0–10.5)
WBC: 19.5 10*3/uL — ABNORMAL HIGH (ref 4.0–10.5)
nRBC: 0 % (ref 0.0–0.2)
nRBC: 0 % (ref 0.0–0.2)

## 2021-06-16 LAB — URINALYSIS, ROUTINE W REFLEX MICROSCOPIC
Bacteria, UA: NONE SEEN
Bilirubin Urine: NEGATIVE
Glucose, UA: NEGATIVE mg/dL
Ketones, ur: 5 mg/dL — AB
Leukocytes,Ua: NEGATIVE
Nitrite: NEGATIVE
Protein, ur: NEGATIVE mg/dL
Specific Gravity, Urine: 1.006 (ref 1.005–1.030)
pH: 6 (ref 5.0–8.0)

## 2021-06-16 LAB — HIV ANTIBODY (ROUTINE TESTING W REFLEX): HIV Screen 4th Generation wRfx: NONREACTIVE

## 2021-06-16 MED ORDER — IBUPROFEN 200 MG PO TABS
600.0000 mg | ORAL_TABLET | Freq: Three times a day (TID) | ORAL | Status: DC
  Administered 2021-06-16 – 2021-06-18 (×7): 600 mg via ORAL
  Filled 2021-06-16: qty 1
  Filled 2021-06-16: qty 3
  Filled 2021-06-16: qty 1
  Filled 2021-06-16 (×5): qty 3

## 2021-06-16 MED ORDER — ACETAMINOPHEN 500 MG PO TABS
1000.0000 mg | ORAL_TABLET | Freq: Four times a day (QID) | ORAL | Status: DC
  Administered 2021-06-16 – 2021-06-18 (×8): 1000 mg via ORAL
  Filled 2021-06-16 (×8): qty 2

## 2021-06-16 MED ORDER — OXYCODONE HCL 5 MG PO TABS
5.0000 mg | ORAL_TABLET | ORAL | Status: DC | PRN
Start: 2021-06-16 — End: 2021-06-18
  Administered 2021-06-16 – 2021-06-17 (×4): 10 mg via ORAL
  Filled 2021-06-16 (×4): qty 2

## 2021-06-16 MED ORDER — LIDOCAINE 5 % EX PTCH
1.0000 | MEDICATED_PATCH | CUTANEOUS | Status: DC
  Administered 2021-06-16 – 2021-06-18 (×3): 1 via TRANSDERMAL
  Filled 2021-06-16 (×3): qty 1

## 2021-06-16 NOTE — Progress Notes (Signed)
PT Cancellation Note  Patient Details Name: Andre Frost MRN: [id number] DOB: 1986-03-27   Cancelled Treatment:    Reason Eval/Treat Not Completed: Medical issues which prohibited therapy.  Pt is not in a condition to disconnect his chest tube, and is limited by his O2 sats dropping below 88% as well.  Follow up with pt as his condition and time permit.   Ivar Drape 06/16/2021, 12:51 PM  Samul Dada, PT PhD Acute Rehab Dept. Number: [facility] [id number] and [facility] [phone]
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
## 2021-06-17 ENCOUNTER — Inpatient Hospital Stay (HOSPITAL_COMMUNITY): Payer: No Typology Code available for payment source

## 2021-06-17 LAB — CBC
HCT: 37.6 % — ABNORMAL LOW (ref 39.0–52.0)
Hemoglobin: 12.8 g/dL — ABNORMAL LOW (ref 13.0–17.0)
MCH: 30.5 pg (ref 26.0–34.0)
MCHC: 34 g/dL (ref 30.0–36.0)
MCV: 89.7 fL (ref 80.0–100.0)
Platelets: 282 10*3/uL (ref 150–400)
RBC: 4.19 MIL/uL — ABNORMAL LOW (ref 4.22–5.81)
RDW: 13 % (ref 11.5–15.5)
WBC: 15.2 10*3/uL — ABNORMAL HIGH (ref 4.0–10.5)
nRBC: 0 % (ref 0.0–0.2)
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
## 2021-06-18 ENCOUNTER — Inpatient Hospital Stay (HOSPITAL_COMMUNITY): Payer: No Typology Code available for payment source

## 2021-06-18 ENCOUNTER — Other Ambulatory Visit (HOSPITAL_COMMUNITY): Payer: Self-pay

## 2021-06-18 MED ORDER — OXYCODONE HCL 5 MG PO TABS
5.0000 mg | ORAL_TABLET | Freq: Four times a day (QID) | ORAL | 0 refills | Status: DC | PRN
  Filled 2021-06-18: qty 20, 5d supply, fill #0

## 2021-06-18 MED ORDER — ACETAMINOPHEN 500 MG PO TABS: 1000.0000 mg | ORAL_TABLET | Freq: Three times a day (TID) | ORAL | 0 refills | Status: AC | PRN

## 2021-06-18 NOTE — Progress Notes (Signed)
PT Cancellation Note  Patient Details Name: Andre Frost MRN: [id number] DOB: [birth year]   Cancelled Treatment:    Reason Eval/Treat Not Completed: Other (comment) Removal of chest tube earlier this AM. Waiting for chest x-ray following removal for clearance to mobilize with PT. PT will re-attempt as time allows.   Julius Matus A. Dan Humphreys PT, DPT Acute Rehabilitation Services Pager [phone] Office [phone]    Andre Frost 06/18/2021, 4:09 PM
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
## 2021-06-18 NOTE — Discharge Instructions (Addendum)
PNEUMOTHORAX OR HEMOTHORAX +/- RIB FRACTURES  HOME INSTRUCTIONS   PAIN CONTROL:  Pain is best controlled by a usual combination of three different methods TOGETHER:  Ice/Heat Over the counter pain medication Prescription pain medication You may experience some swelling and bruising in area of broken ribs. Ice packs or heating pads (30-60 minutes up to 6 times a day) will help. Use ice for the first few days to help decrease swelling and bruising, then switch to heat to help relax tight/sore spots and speed recovery. Some people prefer to use ice alone, heat alone, alternating between ice & heat. Experiment to what works for you. Swelling and bruising can take several weeks to resolve.  It is helpful to take an over-the-counter pain medication regularly for the first few weeks. Choose one of the following that works best for you:  Naproxen (Aleve, etc) Two 220mg tabs twice a day Ibuprofen (Advil, etc) Three 200mg tabs four times a day (every meal & bedtime) Acetaminophen (Tylenol, etc) 500-650mg four times a day (every meal & bedtime) A prescription for pain medication (such as oxycodone, hydrocodone, etc) may be given to you upon discharge. Take your pain medication as prescribed.  If you are having problems/concerns with the prescription medicine (does not control pain, nausea, vomiting, rash, itching, etc), please call us [phone] to see if we need to switch you to a different pain medicine that will work better for you and/or control your side effect better. If you need a refill on your pain medication, please contact your pharmacy. They will contact our office to request authorization. Prescriptions will not be filled after 5 pm or on week-ends. Avoid getting constipated. When taking pain medications, it is common to experience some constipation. Increasing fluid intake and taking a fiber supplement (such as Metamucil, Citrucel, FiberCon, MiraLax, etc) 1-2 times a day regularly will  usually help prevent this problem from occurring. A mild laxative (prune juice, Milk of Magnesia, MiraLax, etc) should be taken according to package directions if there are no bowel movements after 48 hours.  Watch out for diarrhea. If you have many loose bowel movements, simplify your diet to bland foods & liquids for a few days. Stop any stool softeners and decrease your fiber supplement. Switching to mild anti-diarrheal medications (Kayopectate, Pepto Bismol) can help. If this worsens or does not improve, please call us. Chest tube site wound: you may remove the dressing from your chest tube site 3 days after the removal of your chest tube. DO NOT shower over the dressing. Once   removed, you may shower as normal. Do not submerge your wound in water for 2-3 weeks.  FOLLOW UP  Please call our office to set up or confirm an appointment for follow up for 2 weeks after discharge. You will need to get a chest xray at either [facility] or [facility]. This will be outlined in your follow up instructions. Please call [facility] at [phone] if you have any questions about follow up.  If you have any orthopedic or other injuries you will need to follow up as outlined in your follow up instructions.   WHEN TO CALL US [phone]:  Poor pain control Reactions / problems with new medications (rash/itching, nausea, etc)  Fever over 101.5 F (38.5 C) Worsening swelling or bruising Redness, drainage, pain or swelling around chest tube site Worsening pain, productive cough, difficulty breathing or any other concerning symptoms  The clinic staff is available to answer your questions during   regular business hours (8:30am-5pm). Please don't hesitate to call and ask to speak to one of our nurses for clinical concerns.  If you have a medical emergency, go to the nearest emergency room or call 911.  A surgeon from [facility] is always on call at the hospitals   [facility]   [address], [address], [place], [place] [address] ?  MAIN: [phone] ? TOLL FREE: [phone] ?  FAX [phone]  [email or web address]      Information on Rib Fractures  A rib fracture is a break or crack in one of the bones of the ribs. The ribs are long, curved bones that wrap around your chest and attach to your spine and your breastbone. The ribs protect your heart, lungs, and other organs in the chest. A broken or cracked rib is often painful but is not usually serious. Most rib fractures heal on their own over time. However, rib fractures can be more serious if multiple ribs are broken or if broken ribs move out of place and push against other structures or organs. What are the causes? This condition is caused by: Repetitive movements with high force, such as pitching a baseball or having severe coughing spells. A direct blow to the chest, such as a sports injury, a car accident, or a fall. Cancer that has spread to the bones, which can weaken bones and cause them to break. What are the signs or symptoms? Symptoms of this condition include: Pain when you breathe in or cough. Pain when someone presses on the injured area. Feeling short of breath. How is this diagnosed? This condition is diagnosed with a physical exam and medical history. Imaging tests may also be done, such as: Chest X-ray. CT scan. MRI. Bone scan. Chest ultrasound. How is this treated? Treatment for this condition depends on the severity of the fracture. Most rib fractures usually heal on their own in 1-3 months. Sometimes healing takes longer if there is a cough that does not stop or if there are other activities that make the injury worse (aggravating factors). While you heal, you will be given medicines to control the pain. You will also be taught deep breathing exercises. Severe injuries may require hospitalization or surgery. Follow these instructions at home: Managing pain,  stiffness, and swelling If directed, apply ice to the injured area. Put ice in a plastic bag. Place a towel between your skin and the bag. Leave the ice on for 20 minutes, 2-3 times a day. Take over-the-counter and prescription medicines only as told by your health care provider. Activity Avoid a lot of activity and any activities or movements that cause pain. Be careful during activities and avoid bumping the injured rib. Slowly increase your activity as told by your health care provider. General instructions Do deep breathing exercises as told by your health care provider. This helps prevent pneumonia, which is a common complication of a broken rib. Your health care provider may instruct you to: Take deep breaths several times a day. Try to cough several times a day, holding a pillow against the injured area. Use a device called incentive spirometer to practice deep breathing several times a day. Drink enough fluid to keep your urine pale yellow. Do not wear a rib belt or binder. These restrict breathing, which can lead to pneumonia. Keep all follow-up visits as told by your health care provider. This is important. Contact a health care provider if: You have a fever. Get   help right away if: You have difficulty breathing or you are short of breath. You develop a cough that does not stop, or you cough up thick or bloody sputum. You have nausea, vomiting, or pain in your abdomen. Your pain gets worse and medicine does not help. Summary A rib fracture is a break or crack in one of the bones of the ribs. A broken or cracked rib is often painful but is not usually serious. Most rib fractures heal on their own over time. Treatment for this condition depends on the severity of the fracture. Avoid a lot of activity and any activities or movements that cause pain. This information is not intended to replace advice given to you by your health care provider. Make sure you discuss any questions  you have with your health care provider. Document Released: 06/13/2005 Document Revised: 09/12/2016 Document Reviewed: 09/12/2016 Elsevier Interactive Patient Education  2019 [employer] Inc[employer]    Pneumothorax A pneumothorax is commonly called a collapsed lung. It is a condition in which air leaks from a lung and builds up between the thin layer of tissue that covers the lungs (visceral pleura) and the interior wall of the chest cavity (parietal pleura). The air gets trapped outside the lung, between the lung and the chest wall (pleural space). The air takes up space and prevents the lung from fully expanding. This condition sometimes occurs suddenly with no apparent cause. The buildup of air may be small or large. A small pneumothorax may go away on its own. A large pneumothorax will require treatment and hospitalization. What are the causes? This condition may be caused by: Trauma and injury to the chest wall. Surgery and other medical procedures. A complication of an underlying lung problem, especially chronic obstructive pulmonary disease (COPD) or emphysema. Sometimes the cause of this condition is not known. What increases the risk? You are more likely to develop this condition if: You have an underlying lung problem. You smoke. You are 20-40 years old, male, tall, and underweight. You have a personal or family history of pneumothorax. You have an eating disorder (anorexia nervosa). This condition can also happen quickly, even in people with no history of lung problems. What are the signs or symptoms? Sometimes a pneumothorax will have no symptoms. When symptoms are present, they can include: Chest pain. Shortness of breath. Increased rate of breathing. Bluish color to your lips or skin (cyanosis). How is this diagnosed? This condition may be diagnosed by: A medical history and physical exam. A chest X-ray, chest CT scan, or ultrasound. How is this treated? Treatment depends on  how severe your condition is. The goal of treatment is to remove the extra air and allow your lung to expand back to its normal size. For a small pneumothorax: No treatment may be needed. Extra oxygen is sometimes used to make it go away more quickly. For a large pneumothorax or a pneumothorax that is causing symptoms, a procedure is done to drain the air from your lungs. To do this, a health care provider may use: A needle with a syringe. This is used to suck air from a pleural space where no additional leakage is taking place. A chest tube. This is used to suck air where there is ongoing leakage into the pleural space. The chest tube may need to remain in place for several days until the air leak has healed. In more severe cases, surgery may be needed to repair the damage that is causing the leak. If you   have multiple pneumothorax episodes or have an air leak that will not heal, a procedure called a pleurodesis may be done. A medicine is placed in the pleural space to irritate the tissues around the lung so that the lung will stick to the chest wall, seal any leaks, and stop any buildup of air in that space. If you have an underlying lung problem, severe symptoms, or a large pneumothorax you will usually need to stay in the hospital. Follow these instructions at home: Lifestyle Do not use any products that contain nicotine or tobacco, such as cigarettes and e-cigarettes. These are major risk factors in pneumothorax. If you need help quitting, ask your health care provider. Do not lift anything that is heavier than 10 lb (4.5 kg), or the limit that your health care provider tells you, until he or she says that it is safe. Avoid activities that take a lot of effort (strenuous) for as long as told by your health care provider. Return to your normal activities as told by your health care provider. Ask your health care provider what activities are safe for you. Do not fly in an airplane or scuba dive  until your health care provider says it is okay. General instructions Take over-the-counter and prescription medicines only as told by your health care provider. If a cough or pain makes it difficult for you to sleep at night, try sleeping in a semi-upright position in a recliner or by using 2 or 3 pillows. If you had a chest tube and it was removed, ask your health care provider when you can remove the bandage (dressing). While the dressing is in place, do not allow it to get wet. Keep all follow-up visits as told by your health care provider. This is important. Contact a health care provider if: You cough up thick mucus (sputum) that is yellow or green in color. You were treated with a chest tube, and you have redness, increasing pain, or discharge at the site where it was placed. Get help right away if: You have increasing chest pain or shortness of breath. You have a cough that will not go away. You begin coughing up blood. You have pain that is getting worse or is not controlled with medicines. The site where your chest tube was located opens up. You feel air coming out of the site where the chest tube was placed. You have a fever or persistent symptoms for more than 2-3 days. You have a fever and your symptoms suddenly get worse. These symptoms may represent a serious problem that is an emergency. Do not wait to see if the symptoms will go away. Get medical help right away. Call your local emergency services (911 in the [place] Do not drive yourself to the hospital. Summary A pneumothorax, commonly called a collapsed lung, is a condition in which air leaks from a lung and gets trapped between the lung and the chest wall (pleural space). The buildup of air may be small or large. A small pneumothorax may go away on its own. A large pneumothorax will require treatment and hospitalization. Treatment for this condition depends on how severe the pneumothorax is. The goal of treatment is to  remove the extra air and allow the lung to expand back to its normal size. This information is not intended to replace advice given to you by your health care provider. Make sure you discuss any questions you have with your health care provider. Document Released: 06/13/2005 Document Revised: 05/22/2017 Document   Reviewed: 05/22/2017 Elsevier Interactive Patient Education  2019 Else[employer] Inc.  

## 2021-06-18 NOTE — Progress Notes (Signed)
Patient discharged to home. Removed 2 IV's. Gave patient discharge instructions, went over them with him. Answered all questions.

## 2021-06-18 NOTE — Progress Notes (Signed)
Chest tube removed per order at 1120 am. Pain medication give prior per patient request. Breath sounds clear and diminished prior to removal; no changes noted afterward. Patient tolerated well. Site covered in petroleum gauze and secured. Will continue to monitor.

## 2021-06-22 ENCOUNTER — Other Ambulatory Visit: Payer: Self-pay

## 2021-06-22 ENCOUNTER — Emergency Department (HOSPITAL_COMMUNITY)
Admission: EM | Admit: 2021-06-22 | Discharge: 2021-06-22 | Disposition: A | Discharge status: Home / Self Care | Payer: No Typology Code available for payment source | Attending: Emergency Medicine | Admitting: Emergency Medicine

## 2021-06-22 ENCOUNTER — Encounter (HOSPITAL_BASED_OUTPATIENT_CLINIC_OR_DEPARTMENT_OTHER): Payer: Self-pay | Admitting: Orthopedic Surgery

## 2021-06-22 ENCOUNTER — Emergency Department (HOSPITAL_COMMUNITY): Payer: No Typology Code available for payment source

## 2021-06-22 DIAGNOSIS — Z4801 Encounter for change or removal of surgical wound dressing: Secondary | ICD-10-CM | POA: Insufficient documentation

## 2021-06-22 DIAGNOSIS — Z5321 Procedure and treatment not carried out due to patient leaving prior to being seen by health care provider: Secondary | ICD-10-CM | POA: Diagnosis not present

## 2021-06-22 DIAGNOSIS — Z5189 Encounter for other specified aftercare: Secondary | ICD-10-CM
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
## 2021-06-22 NOTE — ED Notes (Signed)
NAX3
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
## 2021-12-10 ENCOUNTER — Emergency Department (HOSPITAL_COMMUNITY)
Admission: EM | Admit: 2021-12-10 | Discharge: 2021-12-10 | Disposition: A | Discharge status: Home / Self Care | Payer: Self-pay | Attending: Student | Admitting: Student

## 2021-12-10 ENCOUNTER — Encounter (HOSPITAL_COMMUNITY): Payer: Self-pay

## 2021-12-10 ENCOUNTER — Emergency Department (HOSPITAL_COMMUNITY): Payer: Self-pay

## 2021-12-10 ENCOUNTER — Other Ambulatory Visit: Payer: Self-pay

## 2021-12-10 DIAGNOSIS — M25475 Effusion, left foot: Secondary | ICD-10-CM | POA: Insufficient documentation

## 2021-12-10 DIAGNOSIS — Y990 Civilian activity done for income or pay: Secondary | ICD-10-CM | POA: Insufficient documentation

## 2021-12-10 DIAGNOSIS — W1839XA Other fall on same level, initial encounter: Secondary | ICD-10-CM | POA: Insufficient documentation

## 2021-12-10 MED ORDER — NAPROXEN 500 MG PO TABS: 500.0000 mg | ORAL_TABLET | Freq: Two times a day (BID) | ORAL | 0 refills | Status: AC

## 2021-12-10 MED ORDER — IBUPROFEN 200 MG PO TABS
600.0000 mg | ORAL_TABLET | Freq: Once | ORAL | Status: AC
  Administered 2021-12-10: 600 mg via ORAL
  Filled 2021-12-10: qty 3

## 2021-12-10 NOTE — ED Triage Notes (Signed)
Patient c/o left toe pain. 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 Noticeable swelling of the great toe.

## 2021-12-10 NOTE — ED Provider Notes (Cosign Needed)
Andre Frost-EMERGENCY DEPT Provider Note   CSN: [id number] Arrival date & time: 12/10/21  3016     History  No chief complaint on file.   Andre Frost is a 36 y.o. male.  Andre Frost is a 36 y.o. male who is otherwise healthy, presents to the emergency department for evaluation of pain in his left great toe.  He reports that while at work he was pushing a lawnmower uphill when he tripped falling forward and pushing all of his weight onto his great toe.  He reports he felt like he heard a pop and since then it has been swollen and painful at the base of his great toe.  He reports he was wearing close toed shoes and did not sustain any wounds to the toe, no bleeding.  Nail is intact.  No numbness, tingling or weakness.  Pain is worse with weightbearing or movement.  No medications prior to arrival.  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  The history is provided by the patient.       Home Medications Prior to Admission medications   Medication Sig Start Date End Date Taking? Authorizing Provider  naproxen (NAPROSYN) 500 MG tablet Take 1 tablet (500 mg total) by mouth 2 (two) times daily. 12/10/21  Yes Andre Lodge, PA-C  acetaminophen (TYLENOL) 500 MG tablet Take 2 tablets (1,000 mg total) by mouth every 8 (eight) hours as needed. 06/18/21   Maczis, Elmer Sow, PA-C  ibuprofen (ADVIL) 200 MG tablet Take 200 mg by mouth every 6 (six) hours as needed.    [provider]  oxyCODONE (OXY IR/ROXICODONE) 5 MG immediate release tablet Take 1 tablet (5 mg total) by mouth every 6 (six) hours as needed for breakthrough pain. 06/18/21   Maczis, Elmer Sow, PA-C  oxyCODONE-acetaminophen (PERCOCET) 5-325 MG tablet 1-2 tabs po q6 hours prn pain 10/19/20   Andre Loa, MD      Allergies    Shellfish allergy, Shellfish allergy, Sulfa antibiotics, and Sulfa antibiotics    Review of Systems   Review of Systems   Musculoskeletal:  Positive for arthralgias.    Physical Exam Updated Vital Signs BP 139/79   Pulse 88   Temp 98.4 F (36.9 C) (Oral)   Resp 18   Ht 6' (1.829 m)   Wt 65.8 kg   SpO2 99%   BMI 19.67 kg/m  Physical Exam Vitals and nursing note reviewed.  Constitutional:      General: He is not in acute distress.    Appearance: Normal appearance. He is well-developed. He is not ill-appearing or diaphoretic.  HENT:     Head: Normocephalic and atraumatic.  Eyes:     General:        Right eye: No discharge.        Left eye: No discharge.  Pulmonary:     Effort: Pulmonary effort is normal. No respiratory distress.  Musculoskeletal:     Comments: Tenderness over the MTP joint at the base of the left great toe with some swelling present, no erythema or warmth.  Patient able to move toe with some discomfort.  Normal sensation.  No tenderness throughout the rest of the toes or the foot.  2+ DP pulse and normal cap refill in the great toe.  Skin:    General: Skin is warm and dry.  Neurological:     Mental Status: He is alert and  oriented to person, place, and time.     Coordination: Coordination normal.  Psychiatric:        Mood and Affect: Mood normal.        Behavior: Behavior normal.     ED Results / Procedures / Treatments   Labs (all labs ordered are listed, but only abnormal results are displayed) Labs Reviewed - No data to display  EKG None  Radiology DG Foot Complete Left  Result Date: 12/10/2021 CLINICAL DATA:  Great toe pain and swelling EXAM: LEFT FOOT - COMPLETE 3+ VIEW COMPARISON:  None Available. FINDINGS: There is no evidence of fracture or dislocation. 6 mm corticated bony density projects along the anterior tibiotalar joint line, possibly representing a loose body. Joint spaces of the left foot are preserved. Soft tissue swelling at the level of the first MTP joint. IMPRESSION: 1. No acute fracture or dislocation of the left foot. 2. Soft tissue swelling at  the level of the first MTP joint. 3. Possible small tibiotalar joint loose body. Electronically Signed   By: Duanne Guess D.O.   On: 12/10/2021 10:33    Procedures Procedures    Medications Ordered in ED Medications  ibuprofen (ADVIL) tablet 600 mg (600 mg Oral Given 12/10/21 1914)    ED Course/ Medical Decision Making/ A&P                           Medical Decision Making Amount and/or Complexity of Data Reviewed Radiology: ordered.  Risk OTC drugs. Prescription drug management.   Patient presents with pain in the left great toe after tripping and putting all of his weight into the toe wall mowing at work.  On arrival he has swelling at the MTP joint of the left great toe, but the foot is neurovascularly intact.  X-ray of the left foot obtained and I have personally viewed and interpreted imaging with no evidence of fracture or dislocation.  Suspect sprain or turf toe, also considered gout although this is less likely in the setting of specific injury.  Exam is not concerning for septic arthritis.  Patient provided postop shoe for support and crutches as needed but patient can bear weight as tolerated.  Encouraged to use NSAIDs and follow-up with podiatry if symptoms not improving.  At this time there does not appear to be any evidence of an acute emergency medical condition requiring further emergent evaluation and the patient appears stable for discharge with appropriate outpatient follow up. Diagnosis and return precautions discussed with patient who verbalizes understanding and is agreeable to discharge.           Final Clinical Impression(s) / ED Diagnoses Final diagnoses:  Swelling of first metatarsophalangeal (MTP) joint of left foot    Rx / DC Orders ED Discharge Orders          Ordered    naproxen (NAPROSYN) 500 MG tablet  2 times daily        12/10/21 1108              Andre Frost [place], [place] 12/10/21 2130

## 2021-12-10 NOTE — Discharge Instructions (Addendum)
Pain likely due to a sprain of the joint at the base of your first toe, x-ray does not show any evidence of fracture.  Could also be gout but this is less likely.  Take NSAIDs twice daily as prescribed, use postop shoe for support and crutches as needed.  You can put weight on the foot as tolerated.  You can also elevate and apply ice.  Follow-up with podiatry if symptoms not improving.  If you develop spreading swelling or redness, fevers or other new or concerning symptoms return for reevaluation.

## 2022-04-22 ENCOUNTER — Other Ambulatory Visit: Payer: Self-pay

## 2022-04-22 ENCOUNTER — Emergency Department (HOSPITAL_BASED_OUTPATIENT_CLINIC_OR_DEPARTMENT_OTHER)
Admission: EM | Admit: 2022-04-22 | Discharge: 2022-04-22 | Disposition: A | Discharge status: Home / Self Care | Payer: Self-pay | Attending: Emergency Medicine | Admitting: Emergency Medicine

## 2022-04-22 ENCOUNTER — Encounter (HOSPITAL_BASED_OUTPATIENT_CLINIC_OR_DEPARTMENT_OTHER): Payer: Self-pay | Admitting: Emergency Medicine

## 2022-04-22 DIAGNOSIS — S60512A Abrasion of left hand, initial encounter: Secondary | ICD-10-CM | POA: Insufficient documentation

## 2022-04-22 DIAGNOSIS — S022XXA Fracture of nasal bones, initial encounter for closed fracture: Secondary | ICD-10-CM | POA: Insufficient documentation

## 2022-04-22 MED ORDER — AMOXICILLIN-POT CLAVULANATE 875-125 MG PO TABS: 1.0000 | ORAL_TABLET | Freq: Two times a day (BID) | ORAL | 0 refills | Status: AC

## 2022-04-22 MED ORDER — OXYCODONE HCL 5 MG PO TABS
5.0000 mg | ORAL_TABLET | Freq: Four times a day (QID) | ORAL | 0 refills | Status: AC | PRN
Start: 2022-04-22 — End: ?
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
## 2022-04-22 NOTE — ED Notes (Signed)
Reviewed AVS/discharge instruction with patient. Time allotted for and all questions answered. Patient is agreeable for d/c and escorted to ed exit by staff.  
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
## 2024-01-09 IMAGING — DX DG FOOT COMPLETE 3+V*L*
3 series · 3 of 3 positions shown · non-contrast
Comparison: None Available.

CLINICAL DATA: Great toe pain and swelling

EXAM:
LEFT FOOT - COMPLETE 3+ VIEW

[foot ap]
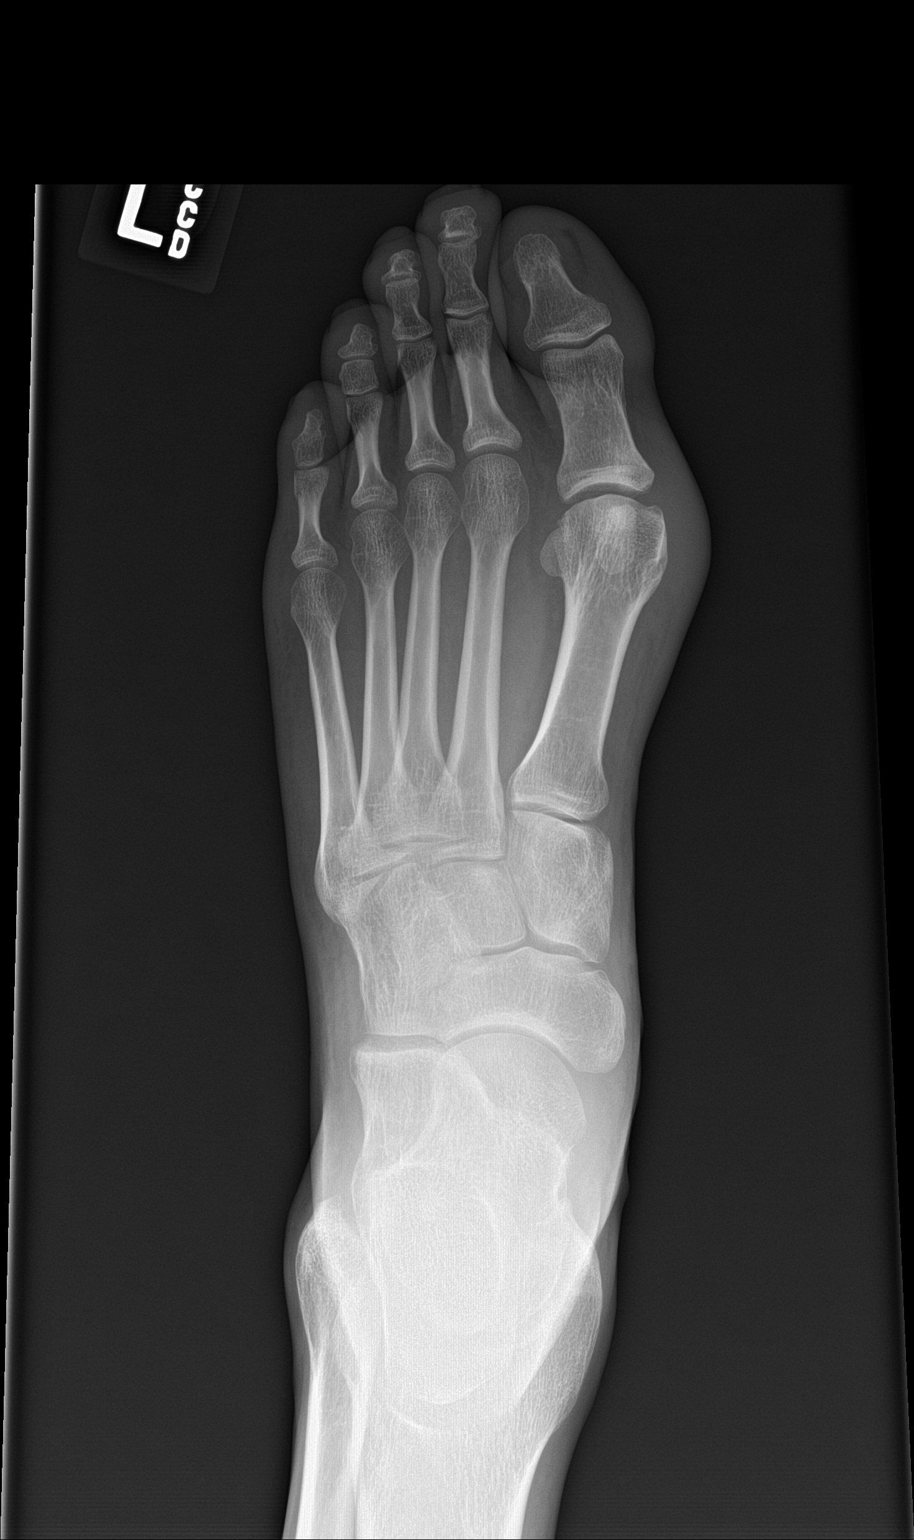

[foot obl]
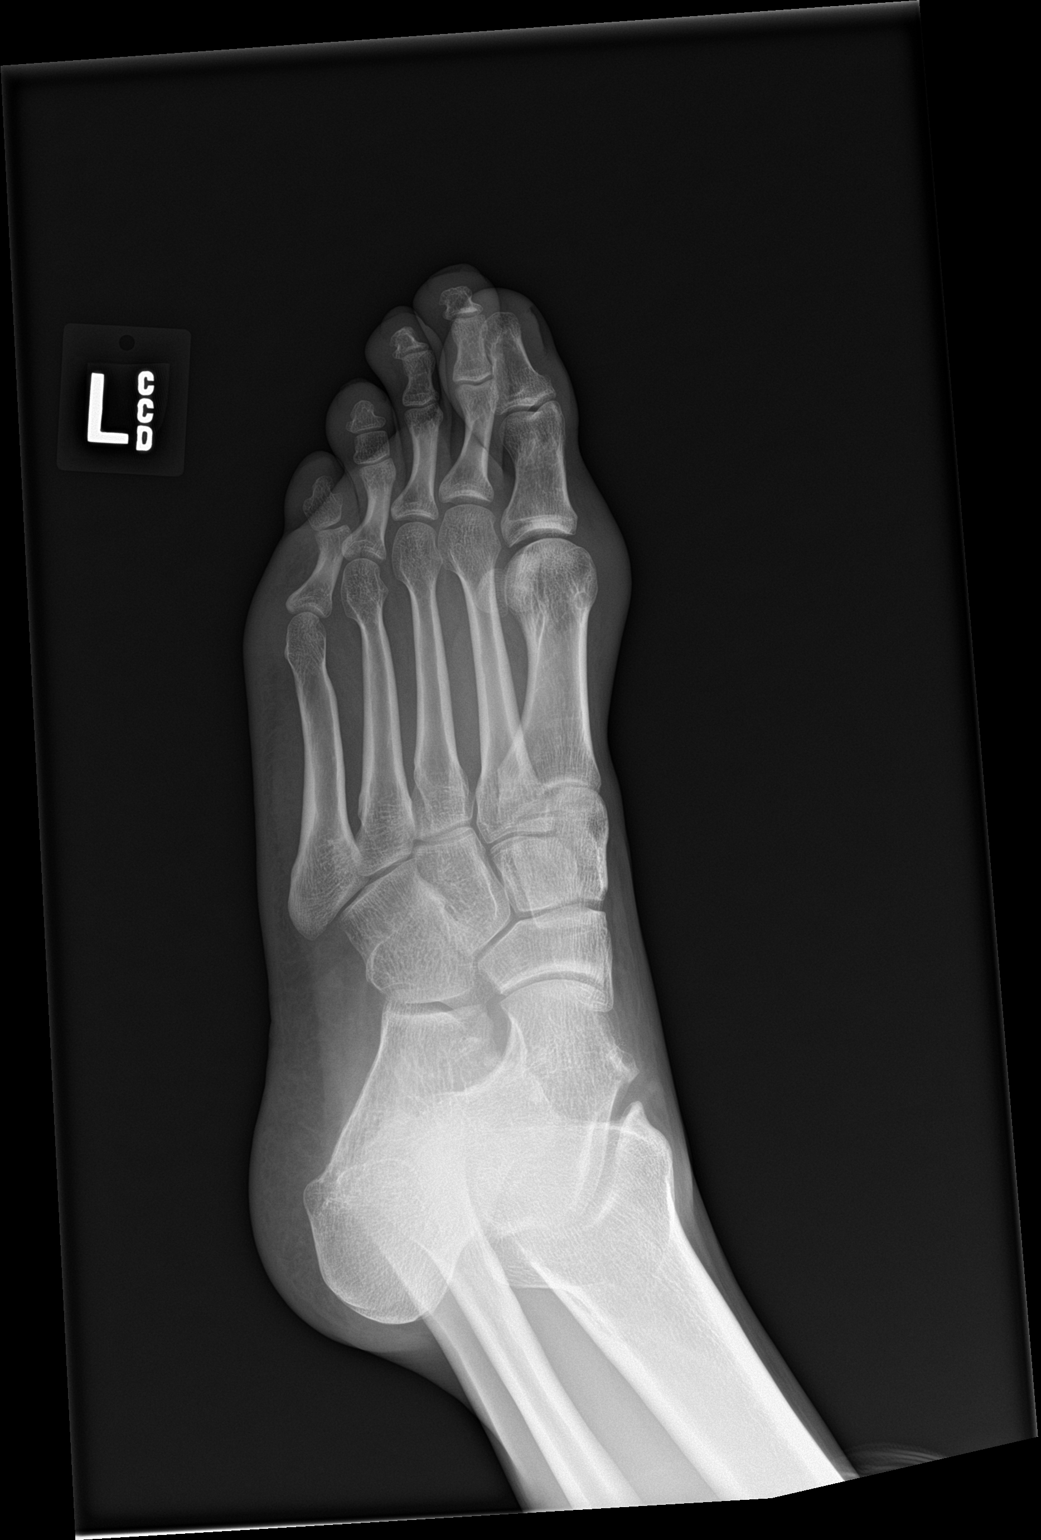

[foot lat]
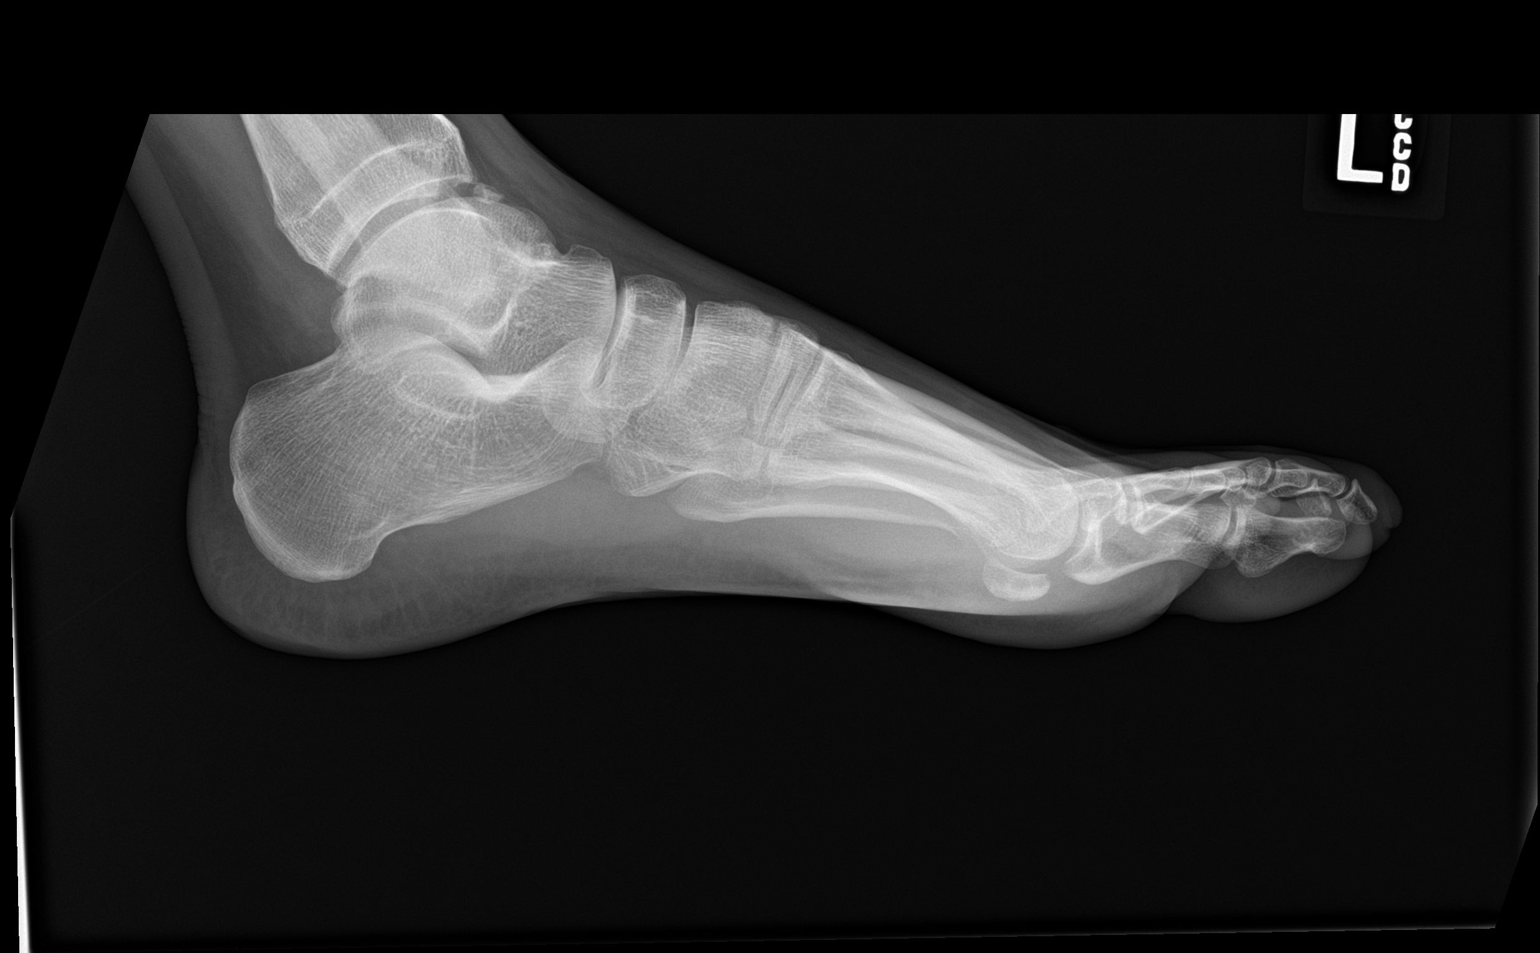

[3 of 3 positions shown; findings below may reference images not displayed]

FINDINGS: There is no evidence of fracture or dislocation. 6 mm corticated
bony density projects along the anterior tibiotalar joint line,
possibly representing a loose body. Joint spaces of the left foot
are preserved. Soft tissue swelling at the level of the first MTP
joint.
IMPRESSION: 1. No acute fracture or dislocation of the left foot.
2. Soft tissue swelling at the level of the first MTP joint.
3. Possible small tibiotalar joint loose body.

## 2024-04-29 ENCOUNTER — Emergency Department (HOSPITAL_COMMUNITY): Payer: Self-pay

## 2024-04-29 ENCOUNTER — Emergency Department (HOSPITAL_COMMUNITY)
Admission: EM | Admit: 2024-04-29 | Discharge: 2024-04-29 | Disposition: A | Discharge status: Home / Self Care | Payer: Self-pay | Attending: Emergency Medicine | Admitting: Emergency Medicine

## 2024-04-29 ENCOUNTER — Encounter (HOSPITAL_COMMUNITY): Payer: Self-pay | Admitting: *Deleted

## 2024-04-29 ENCOUNTER — Other Ambulatory Visit: Payer: Self-pay

## 2024-04-29 DIAGNOSIS — S0591XA Unspecified injury of right eye and orbit, initial encounter: Secondary | ICD-10-CM

## 2024-04-29 DIAGNOSIS — H1131 Conjunctival hemorrhage, right eye: Secondary | ICD-10-CM | POA: Insufficient documentation

## 2024-04-29 DIAGNOSIS — S60222A Contusion of left hand, initial encounter: Secondary | ICD-10-CM | POA: Insufficient documentation

## 2024-04-29 MED ORDER — POLYMYXIN B-TRIMETHOPRIM 10000-0.1 UNIT/ML-% OP SOLN
1.0000 [drp] | OPHTHALMIC | Status: AC
  Administered 2024-04-29: 1 [drp] via OPHTHALMIC
  Filled 2024-04-29: qty 10

## 2024-04-29 MED ORDER — FLUORESCEIN SODIUM 1 MG OP STRP
1.0000 | ORAL_STRIP | Freq: Once | OPHTHALMIC | Status: AC
  Administered 2024-04-29: 1 via OPHTHALMIC
  Filled 2024-04-29: qty 1

## 2024-04-29 MED ORDER — OXYCODONE-ACETAMINOPHEN 5-325 MG PO TABS
1.0000 | ORAL_TABLET | Freq: Once | ORAL | Status: AC
  Administered 2024-04-29: 1 via ORAL
  Filled 2024-04-29: qty 1

## 2024-04-29 MED ORDER — TOBRAMYCIN 0.3 % OP SOLN: 2.0000 [drp] | Freq: Four times a day (QID) | OPHTHALMIC | Status: DC

## 2024-04-29 MED ORDER — TETRACAINE HCL 0.5 % OP SOLN
1.0000 [drp] | Freq: Once | OPHTHALMIC | Status: AC
  Administered 2024-04-29: 1 [drp] via OPHTHALMIC
  Filled 2024-04-29: qty 4

## 2024-04-29 MED ORDER — NAPROXEN 250 MG PO TABS
500.0000 mg | ORAL_TABLET | Freq: Once | ORAL | Status: AC
  Administered 2024-04-29: 500 mg via ORAL
  Filled 2024-04-29: qty 2
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
